# Patient Record
Sex: Male | Born: 1965 | Race: White | Hispanic: No | Marital: Married | Smoking: Light tobacco smoker
Health system: Southern US, Community
[De-identification: ages and names within clinical notes are randomized; demographics above are authoritative.]

## PROBLEM LIST (undated history)

## (undated) DIAGNOSIS — B029 Zoster without complications: Secondary | ICD-10-CM

## (undated) DIAGNOSIS — I351 Nonrheumatic aortic (valve) insufficiency: Secondary | ICD-10-CM

## (undated) DIAGNOSIS — IMO0002 Reserved for concepts with insufficient information to code with codable children: Secondary | ICD-10-CM

## (undated) DIAGNOSIS — Z86010 Personal history of colonic polyps: Secondary | ICD-10-CM

## (undated) DIAGNOSIS — T7840XA Allergy, unspecified, initial encounter: Secondary | ICD-10-CM

## (undated) DIAGNOSIS — Q231 Congenital insufficiency of aortic valve: Secondary | ICD-10-CM

## (undated) DIAGNOSIS — E7800 Pure hypercholesterolemia, unspecified: Secondary | ICD-10-CM

## (undated) DIAGNOSIS — D869 Sarcoidosis, unspecified: Secondary | ICD-10-CM

## (undated) HISTORY — PX: TONSILLECTOMY: SUR1361

## (undated) HISTORY — DX: Zoster without complications: B02.9

## (undated) HISTORY — PX: HERNIA REPAIR: SHX51

## (undated) HISTORY — DX: Allergy, unspecified, initial encounter: T78.40XA

## (undated) HISTORY — DX: Nonrheumatic aortic (valve) insufficiency: I35.1

## (undated) HISTORY — DX: Sarcoidosis, unspecified: D86.9

## (undated) HISTORY — DX: Pure hypercholesterolemia, unspecified: E78.00

## (undated) HISTORY — DX: Personal history of colonic polyps: Z86.010

## (undated) HISTORY — DX: Reserved for concepts with insufficient information to code with codable children: IMO0002

---

## 2010-12-19 ENCOUNTER — Ambulatory Visit (INDEPENDENT_AMBULATORY_CARE_PROVIDER_SITE_OTHER): Payer: Self-pay | Admitting: Cardiology

## 2010-12-19 ENCOUNTER — Encounter: Payer: Self-pay | Admitting: Cardiology

## 2010-12-19 DIAGNOSIS — I359 Nonrheumatic aortic valve disorder, unspecified: Secondary | ICD-10-CM

## 2010-12-19 DIAGNOSIS — E785 Hyperlipidemia, unspecified: Secondary | ICD-10-CM | POA: Insufficient documentation

## 2010-12-19 DIAGNOSIS — I351 Nonrheumatic aortic (valve) insufficiency: Secondary | ICD-10-CM | POA: Insufficient documentation

## 2010-12-19 DIAGNOSIS — Z720 Tobacco use: Secondary | ICD-10-CM | POA: Insufficient documentation

## 2010-12-19 DIAGNOSIS — F17200 Nicotine dependence, unspecified, uncomplicated: Secondary | ICD-10-CM

## 2010-12-19 MED ORDER — VARENICLINE TARTRATE 1 MG PO TABS: 1.0000 mg | ORAL_TABLET | Freq: Two times a day (BID) | ORAL | Status: AC

## 2010-12-19 MED ORDER — VARENICLINE TARTRATE 1 MG PO TABS: 1.0000 mg | ORAL_TABLET | Freq: Two times a day (BID) | ORAL | Status: DC

## 2010-12-19 NOTE — Assessment & Plan Note (Signed)
He understands the need to stop smoking.  He quit before on Chantix and he wants to try this again.

## 2010-12-19 NOTE — Assessment & Plan Note (Signed)
He reports that this is well controlled.  I will defer to Garlan Fillers, MD

## 2010-12-19 NOTE — Patient Instructions (Addendum)
The current medical regimen is effective;  continue present plan and medications.  You may start Chantix for smoking cessation as directed.  Your physician has requested that you have an echocardiogram. Echocardiography is a painless test that uses sound waves to create images of your heart. It provides your doctor with information about the size and shape of your heart and how well your heart's chambers and valves are working. This procedure takes approximately one hour. There are no restrictions for this procedure.  Follow up in 1 year with Dr Antoine Poche.  You will receive a letter in the mail 2 months before you are due.  Please call us when you receive this letter to schedule your follow up appointment.

## 2010-12-19 NOTE — Assessment & Plan Note (Signed)
He has no symptoms.  He is overdue for follow up echo.  I will order this.  We discussed the physiology of this valve problem at length.

## 2010-12-19 NOTE — Progress Notes (Signed)
HPI The patient presents for follow up of aortic insufficiency.  He was previously followed by Dr. Deborah Chalk.  He has a history bicuspid Ao Valve with AI.  His last echo was 2010.  He had moderate to severe insufficiency with mild LVH. His LV end-diastolic dimension was only 57 mm however. He presents for follow and is overdue for his one year visit.  He reports that he does well.  The patient denies any new symptoms such as chest discomfort, neck or arm discomfort. There has been no new shortness of breath, PND or orthopnea. There have been no reported palpitations, presyncope or syncope.  He exercises 3 to 4 times per week.  He did stop smoking for a while.  However, he is back to smoking now.  No Known Allergies  Current Outpatient Prescriptions  Medication Sig Dispense Refill  . aspirin 325 MG tablet Take 325 mg by mouth as needed.        Marland Kitchen co-enzyme Q-10 30 MG capsule Take 30 mg by mouth daily.        Marland Kitchen LOVASTATIN PO Take by mouth daily.          Past Medical History  Diagnosis Date  . Aortic insufficiency   . Hypercholesteremia   . DDD (degenerative disc disease)   . Tobacco abuse     Past Surgical History  Procedure Date  . Hernia repair     ROS:  As stated in the HPI and negative for all other systems.  PHYSICAL EXAM BP 118/62  Pulse 57  Resp 16  Ht 6\' 4"  (1.93 m)  Wt 215 lb (97.523 kg)  BMI 26.17 kg/m2 GENERAL:  Well appearing HEENT:  Pupils equal round and reactive, fundi not visualized, oral mucosa unremarkable NECK:  No jugular venous distention, waveform within normal limits, carotid upstroke brisk and symmetric, no bruits, no thyromegaly LYMPHATICS:  No cervical, inguinal adenopathy LUNGS:  Clear to auscultation bilaterally BACK:  No CVA tenderness CHEST:  Unremarkable HEART:  PMI not displaced or sustained,S1 and S2 within normal limits, no S3, no S4, no clicks, no rubs, 2/6 diastolic murmur at the 3rd left interspace ABD:  Flat, positive bowel sounds normal in  frequency in pitch, no bruits, no rebound, no guarding, no midline pulsatile mass, no hepatomegaly, no splenomegaly EXT:  2 plus pulses throughout, no edema, no cyanosis no clubbing SKIN:  No rashes no nodules NEURO:  Cranial nerves II through XII grossly intact, motor grossly intact throughout PSYCH:  Cognitively intact, oriented to person place and time  EKG:  Sinus rhythm, rate 63, axis within normal limits, intervals within normal limits, no acute ST-T wave changes.   ASSESSMENT AND PLAN

## 2010-12-26 ENCOUNTER — Encounter: Payer: Self-pay | Admitting: *Deleted

## 2010-12-26 ENCOUNTER — Ambulatory Visit (HOSPITAL_COMMUNITY): Payer: BC Managed Care – PPO | Attending: Cardiology

## 2010-12-26 DIAGNOSIS — I351 Nonrheumatic aortic (valve) insufficiency: Secondary | ICD-10-CM

## 2010-12-26 DIAGNOSIS — E785 Hyperlipidemia, unspecified: Secondary | ICD-10-CM | POA: Insufficient documentation

## 2010-12-26 DIAGNOSIS — I359 Nonrheumatic aortic valve disorder, unspecified: Secondary | ICD-10-CM

## 2010-12-26 DIAGNOSIS — I080 Rheumatic disorders of both mitral and aortic valves: Secondary | ICD-10-CM | POA: Insufficient documentation

## 2010-12-26 DIAGNOSIS — F17200 Nicotine dependence, unspecified, uncomplicated: Secondary | ICD-10-CM | POA: Insufficient documentation

## 2010-12-26 DIAGNOSIS — I079 Rheumatic tricuspid valve disease, unspecified: Secondary | ICD-10-CM | POA: Insufficient documentation

## 2011-01-11 ENCOUNTER — Encounter: Payer: Self-pay | Admitting: *Deleted

## 2012-03-06 ENCOUNTER — Encounter (HOSPITAL_COMMUNITY): Payer: Self-pay | Admitting: Emergency Medicine

## 2012-03-06 ENCOUNTER — Emergency Department (HOSPITAL_COMMUNITY): Payer: BC Managed Care – PPO

## 2012-03-06 ENCOUNTER — Other Ambulatory Visit: Payer: Self-pay

## 2012-03-06 ENCOUNTER — Observation Stay (HOSPITAL_COMMUNITY)
Admission: EM | Admit: 2012-03-06 | Discharge: 2012-03-08 | DRG: 099 | Disposition: A | Discharge status: Home / Self Care | Payer: BC Managed Care – PPO | Attending: Internal Medicine | Admitting: Internal Medicine

## 2012-03-06 DIAGNOSIS — Q231 Congenital insufficiency of aortic valve: Secondary | ICD-10-CM | POA: Insufficient documentation

## 2012-03-06 DIAGNOSIS — R918 Other nonspecific abnormal finding of lung field: Principal | ICD-10-CM | POA: Insufficient documentation

## 2012-03-06 DIAGNOSIS — Z7982 Long term (current) use of aspirin: Secondary | ICD-10-CM | POA: Insufficient documentation

## 2012-03-06 DIAGNOSIS — J189 Pneumonia, unspecified organism: Secondary | ICD-10-CM

## 2012-03-06 DIAGNOSIS — B49 Unspecified mycosis: Secondary | ICD-10-CM

## 2012-03-06 DIAGNOSIS — F17200 Nicotine dependence, unspecified, uncomplicated: Secondary | ICD-10-CM | POA: Insufficient documentation

## 2012-03-06 DIAGNOSIS — D869 Sarcoidosis, unspecified: Secondary | ICD-10-CM

## 2012-03-06 DIAGNOSIS — R059 Cough, unspecified: Secondary | ICD-10-CM | POA: Insufficient documentation

## 2012-03-06 DIAGNOSIS — R05 Cough: Secondary | ICD-10-CM | POA: Insufficient documentation

## 2012-03-06 DIAGNOSIS — Z720 Tobacco use: Secondary | ICD-10-CM

## 2012-03-06 DIAGNOSIS — M549 Dorsalgia, unspecified: Secondary | ICD-10-CM | POA: Insufficient documentation

## 2012-03-06 DIAGNOSIS — I359 Nonrheumatic aortic valve disorder, unspecified: Secondary | ICD-10-CM | POA: Insufficient documentation

## 2012-03-06 DIAGNOSIS — IMO0002 Reserved for concepts with insufficient information to code with codable children: Secondary | ICD-10-CM | POA: Insufficient documentation

## 2012-03-06 DIAGNOSIS — E7800 Pure hypercholesterolemia, unspecified: Secondary | ICD-10-CM | POA: Insufficient documentation

## 2012-03-06 HISTORY — DX: Congenital insufficiency of aortic valve: Q23.1

## 2012-03-06 HISTORY — DX: Sarcoidosis, unspecified: D86.9

## 2012-03-06 LAB — BASIC METABOLIC PANEL
BUN: 14 mg/dL (ref 6–23)
CO2: 24 mEq/L (ref 19–32)
Calcium: 9.6 mg/dL (ref 8.4–10.5)
Chloride: 104 mEq/L (ref 96–112)
Creatinine, Ser: 1.07 mg/dL (ref 0.50–1.35)
GFR calc Af Amer: >90 mL/min (ref >90)
GFR calc non Af Amer: 81 mL/min — ABNORMAL LOW (ref >90)
Glucose, Bld: 97 mg/dL (ref 70–99)
Potassium: 4.2 mEq/L (ref 3.5–5.1)
Sodium: 139 mEq/L (ref 135–145)

## 2012-03-06 LAB — CBC WITH DIFFERENTIAL/PLATELET
Basophils Absolute: 0.1 10*3/uL (ref 0.0–0.1)
Basophils Relative: 1 % (ref 0–1)
Eosinophils Absolute: 0.5 10*3/uL (ref 0.0–0.7)
Eosinophils Relative: 8 % — ABNORMAL HIGH (ref 0–5)
HCT: 47.2 % (ref 39.0–52.0)
Hemoglobin: 16.3 g/dL (ref 13.0–17.0)
Lymphocytes Relative: 19 % (ref 12–46)
Lymphs Abs: 1.3 10*3/uL (ref 0.7–4.0)
MCH: 30.9 pg (ref 26.0–34.0)
MCHC: 34.5 g/dL (ref 30.0–36.0)
MCV: 89.4 fL (ref 78.0–100.0)
Monocytes Absolute: 0.7 10*3/uL (ref 0.1–1.0)
Monocytes Relative: 10 % (ref 3–12)
Neutro Abs: 4.4 10*3/uL (ref 1.7–7.7)
Neutrophils Relative %: 63 % (ref 43–77)
Platelets: 284 10*3/uL (ref 150–400)
RBC: 5.28 MIL/uL (ref 4.22–5.81)
RDW: 12.9 % (ref 11.5–15.5)
WBC: 6.9 10*3/uL (ref 4.0–10.5)

## 2012-03-06 LAB — CBC
HCT: 41.5 % (ref 39.0–52.0)
Hemoglobin: 13.7 g/dL (ref 13.0–17.0)
MCH: 29.4 pg (ref 26.0–34.0)
MCHC: 33 g/dL (ref 30.0–36.0)
MCV: 89.1 fL (ref 78.0–100.0)
Platelets: 236 10*3/uL (ref 150–400)
RBC: 4.66 MIL/uL (ref 4.22–5.81)
RDW: 12.8 % (ref 11.5–15.5)
WBC: 6.2 10*3/uL (ref 4.0–10.5)

## 2012-03-06 LAB — CREATININE, SERUM
Creatinine, Ser: 1.13 mg/dL (ref 0.50–1.35)
GFR calc Af Amer: 88 mL/min — ABNORMAL LOW (ref >90)
GFR calc non Af Amer: 76 mL/min — ABNORMAL LOW (ref >90)

## 2012-03-06 MED ORDER — HYDROMORPHONE HCL PF 1 MG/ML IJ SOLN
1.0000 mg | Freq: Once | INTRAMUSCULAR | Status: AC
  Administered 2012-03-06: 1 mg via INTRAVENOUS
  Filled 2012-03-06: qty 1

## 2012-03-06 MED ORDER — AZITHROMYCIN 250 MG PO TABS
500.0000 mg | ORAL_TABLET | Freq: Once | ORAL | Status: AC
  Administered 2012-03-06: 500 mg via ORAL
  Filled 2012-03-06: qty 2

## 2012-03-06 MED ORDER — OXYCODONE-ACETAMINOPHEN 5-325 MG PO TABS
1.0000 | ORAL_TABLET | Freq: Once | ORAL | Status: DC
  Administered 2012-03-06: 1 via ORAL
  Filled 2012-03-06: qty 1

## 2012-03-06 MED ORDER — PIPERACILLIN-TAZOBACTAM 3.375 G IVPB
3.3750 g | Freq: Once | INTRAVENOUS | Status: AC
  Administered 2012-03-06: 3.375 g via INTRAVENOUS
  Filled 2012-03-06: qty 50

## 2012-03-06 MED ORDER — HYDROMORPHONE HCL PF 1 MG/ML IJ SOLN
1.0000 mg | INTRAMUSCULAR | Status: DC | PRN
  Administered 2012-03-06 – 2012-03-07 (×2): 1 mg via INTRAVENOUS
  Filled 2012-03-06 (×2): qty 1

## 2012-03-06 MED ORDER — ONDANSETRON HCL 4 MG/2ML IJ SOLN
4.0000 mg | Freq: Once | INTRAMUSCULAR | Status: AC
  Administered 2012-03-06: 4 mg via INTRAVENOUS
  Filled 2012-03-06: qty 2

## 2012-03-06 MED ORDER — ENOXAPARIN SODIUM 40 MG/0.4ML ~~LOC~~ SOLN
40.0000 mg | SUBCUTANEOUS | Status: DC
  Administered 2012-03-06: 40 mg via SUBCUTANEOUS
  Filled 2012-03-06 (×3): qty 0.4

## 2012-03-06 MED ORDER — ALUM & MAG HYDROXIDE-SIMETH 200-200-20 MG/5ML PO SUSP: 30.0000 mL | Freq: Four times a day (QID) | ORAL | Status: DC | PRN

## 2012-03-06 MED ORDER — IOHEXOL 350 MG/ML SOLN
100.0000 mL | Freq: Once | INTRAVENOUS | Status: AC | PRN
  Administered 2012-03-06: 100 mL via INTRAVENOUS

## 2012-03-06 MED ORDER — HYDROCODONE-ACETAMINOPHEN 5-325 MG PO TABS
1.0000 | ORAL_TABLET | ORAL | Status: DC | PRN
  Administered 2012-03-07: 2 via ORAL
  Filled 2012-03-06: qty 2

## 2012-03-06 MED ORDER — SIMVASTATIN 20 MG PO TABS
20.0000 mg | ORAL_TABLET | Freq: Every day | ORAL | Status: DC
  Administered 2012-03-06 – 2012-03-07 (×2): 20 mg via ORAL
  Filled 2012-03-06 (×3): qty 1

## 2012-03-06 MED ORDER — DEXTROSE 5 % IV SOLN
1.0000 g | Freq: Once | INTRAVENOUS | Status: AC
  Administered 2012-03-06: 1 g via INTRAVENOUS
  Filled 2012-03-06: qty 10

## 2012-03-06 MED ORDER — PIPERACILLIN-TAZOBACTAM 3.375 G IVPB: 3.3750 g | Freq: Once | INTRAVENOUS | Status: DC

## 2012-03-06 MED ORDER — HYDROMORPHONE HCL PF 1 MG/ML IJ SOLN
1.0000 mg | Freq: Once | INTRAMUSCULAR | Status: DC
  Administered 2012-03-06: 1 mg via INTRAVENOUS
  Filled 2012-03-06: qty 1

## 2012-03-06 MED ORDER — PIPERACILLIN-TAZOBACTAM 3.375 G IVPB
3.3750 g | Freq: Three times a day (TID) | INTRAVENOUS | Status: DC
  Administered 2012-03-06 – 2012-03-07 (×3): 3.375 g via INTRAVENOUS
  Filled 2012-03-06 (×5): qty 50

## 2012-03-06 MED ORDER — OXYCODONE-ACETAMINOPHEN 5-325 MG PO TABS: 1.0000 | ORAL_TABLET | Freq: Once | ORAL | Status: DC

## 2012-03-06 NOTE — ED Notes (Signed)
DR CAMPOS IN TO SPEAK WITH PT

## 2012-03-06 NOTE — ED Provider Notes (Signed)
History     CSN: [id number]  Arrival date & time 03/06/12  1033   First MD Initiated Contact with Patient 03/06/12 1049      Chief Complaint  Patient presents with  . Back Pain    (Consider location/radiation/quality/duration/timing/severity/associated sxs/prior treatment) HPI Comments: Patient presents with gradual onset of left scapular pain for the past 2 days. Patient has also had a persistent non-productive cough for the past week. Pain was severe this morning and patient states that he fell to the floor because the pain was so bad. Pain is difficult to reproduce. Certain specific movements reproduce the pain however there is no tenderness to palpation. Patient took Tylenol yesterday with some relief. 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 No shortness of breath. It is not worse with deep breathing. No fever or cold symptoms. No weakness or tingling in his arms or legs. PERC negative. No leg swelling, h/o blood clots. Onset gradual. Course is constant. Pt is a smoker.   Patient is a 46 y.o. male presenting with back pain. The history is provided by the patient.  Back Pain  Pertinent negatives include no chest pain, no fever, no numbness and no weakness.    Past Medical History  Diagnosis Date  . Aortic insufficiency   . Hypercholesteremia   . DDD (degenerative disc disease)   . Tobacco abuse     Past Surgical History  Procedure Date  . Hernia repair     History reviewed. No pertinent family history.  History  Substance Use Topics  . Smoking status: Current Every Day Smoker  . Smokeless tobacco: Not on file  . Alcohol Use: Yes      Review of Systems  Constitutional: Negative for fever and unexpected weight change.  Respiratory: Positive for cough. Negative for shortness of breath.   Cardiovascular: Negative for chest pain and leg swelling.  Gastrointestinal: Negative for constipation.       Neg for fecal  incontinence  Genitourinary: Negative for hematuria and flank pain.       Negative for urinary incontinence or retention  Musculoskeletal: Positive for back pain.  Neurological: Negative for weakness and numbness.       Negative for saddle paresthesias     Allergies  Review of patient's allergies indicates no known allergies.  Home Medications   Current Outpatient Rx  Name  Route  Sig  Dispense  Refill  . ASPIRIN 325 MG PO TABS   Oral   Take 650 mg by mouth every 4 (four) hours as needed. For pain         . LOVASTATIN 20 MG PO TABS   Oral   Take 20 mg by mouth daily.           BP 136/63  Pulse 67  Temp 97.8 F (36.6 C) (Oral)  Resp 22  SpO2 98%  Physical Exam  Nursing note and vitals reviewed. Constitutional: He appears well-developed and well-nourished.  HENT:  Head: Normocephalic and atraumatic.  Eyes: Conjunctivae normal are normal.  Neck: Normal range of motion.  Cardiovascular: Normal rate.   Murmur heard.  Crescendo systolic (early) murmur is present with a grade of 2/6  Pulmonary/Chest: Effort normal and breath sounds normal. No respiratory distress. He has no wheezes.  Abdominal: Soft. Bowel sounds are normal. There is no tenderness. There is no rebound, no guarding and no CVA tenderness.  Musculoskeletal: Normal range of motion. He exhibits no tenderness.  Cervical back: Normal. He exhibits normal range of motion, no tenderness and no bony tenderness.       Thoracic back: Normal. He exhibits normal range of motion, no tenderness and no bony tenderness.       Lumbar back: Normal. He exhibits normal range of motion, no tenderness and no bony tenderness.       Back:       No step-off noted with palpation of spine.   Neurological: He is alert. He has normal reflexes. No sensory deficit. He exhibits normal muscle tone.       5/5 strength in entire lower extremities bilaterally. No sensation deficit.   Skin: Skin is warm and dry.  Psychiatric: He has  a normal mood and affect.    ED Course  Procedures (including critical care time)  Labs Reviewed  CBC WITH DIFFERENTIAL - Abnormal; Notable for the following:    Eosinophils Relative 8 (*)     All other components within normal limits  BASIC METABOLIC PANEL - Abnormal; Notable for the following:    GFR calc non Af Amer 81 (*)     All other components within normal limits  CULTURE, BLOOD (ROUTINE X 2)  CULTURE, BLOOD (ROUTINE X 2)  CULTURE, RESPIRATORY   Dg Chest 2 View  03/06/2012  *RADIOLOGY REPORT*  Clinical Data: Shortness of breath, chest pain, left infrascapular chest pain  CHEST - 2 VIEW  Comparison: None.  Findings: There are patchy areas of ill-defined airspace opacity projecting over the bilateral upper lobe, right anterior third rib region and left anterior second rib region, respectively.  No pleural effusion.  No cardiac enlargement.  No acute osseous finding.  IMPRESSION: Bilateral upper lobe patchy airspace opacities.  This finding is nonspecific and could represent alveolar filling processes such as pneumonia, aspiration, hemorrhage, much less likely protein or cells.  Bilateral synchronous upper lobe bronchogenic carcinoma would be less likely but possible given the history of smoking. Follow-up radiographic resolution after treatment is recommended in 4-6 weeks with PA and lateral chest radiographs.  If the finding does not resolve at that time, chest CT would be recommended.   Original Report Authenticated By: Christiana Pellant, M.D.    Ct Angio Chest Pe W/cm &/or Wo Cm  03/06/2012  *RADIOLOGY REPORT*  Clinical Data: Chest pain, airspace opacities on chest radiography performed today  CT ANGIOGRAPHY CHEST  Technique:  Multidetector CT imaging of the chest using the standard protocol during bolus administration of intravenous contrast. Multiplanar reconstructed images including MIPs were obtained and reviewed to evaluate the vascular anatomy.  Contrast: OMNIPAQUE IOHEXOL  350 MG/ML SOLN  Comparison: Chest radiograph same date  Findings: There are a few patchy irregular bilateral upper lobe airspace opacities, corresponding to findings on today's prior exam.  Right upper lobe mass like opacity measures 2.8 x 2.2 cm image 30 and left upper lobe airspace opacity measures 2.2 x 2.0 cm image 21.  Suggestion of internal air bronchogram formation is noted, particularly on the left.  3 mm right upper lobe pulmonary nodule also identified image 41.  Left upper lobe pulmonary nodule identified measuring 5 mm image 40.  Central airways are patent. No pleural effusion.  The study is of adequate technical quality for evaluation for pulmonary embolism up to and including the 3rd order pulmonary arteries.  No filling defect is identified up to and including the 3rd order pulmonary arteries to suggest acute pulmonary embolism. Great vessels are normal in caliber.  Heart size is upper  limits of normal.  No lymphadenopathy; small AP window lymph nodes are identified, largest 7 mm image 29 and pretracheal node measuring 9 mm image 31.  Incomplete imaging of the upper abdomen demonstrates normal- appearing adrenal gland but incomplete visualization of the left adrenal gland.  No acute osseous finding.  IMPRESSION: Bilateral pulmonary nodules and bilateral upper lobe irregular pulmonary airspace opacities with suggestion of internal air bronchogram formation.  This feature and the relatively hazy border of the lesions may be seen with fungal infection, other infectious etiologies, septic emboli, or less likely hemorrhage.  No CT evidence for acute pulmonary arterial embolism up to the 3rd order pulmonary arteries is identified.  Synchronous bronchogenic malignancy is less likely but possible. Follow-up to radiographic resolution as previously described is recommended.   Original Report Authenticated By: Christiana Pellant, M.D.      1. Community acquired pneumonia     11:19 AM Patient seen and  examined. CXR ordered.    Vital signs reviewed and are as follows: Filed Vitals:   03/06/12 1036  BP: 136/63  Pulse: 67  Temp: 97.8 F (36.6 C)  Resp: 22   11:38 AM X-ray reviewed by myself. D/w Dr. Patria Mane. Radiologist report reviewed. Labs, CT angio to further evaluate. Will move to CDU.   Handoff to [facility].    CT reviewed with Dr. Patria Mane. Given vague symptoms with these CT findings, feel admission for further eval is warranted. Medicine to admit. Pulm will consult.    MDM  Cough, abnormal CXR and CT. Admit for further eval. At this point will treat at CAP until process is further delineated.         Renne Crigler, [place] 03/06/12 1544  [place], [place] 03/07/12 1258

## 2012-03-06 NOTE — Progress Notes (Addendum)
ANTIBIOTIC CONSULT NOTE - INITIAL  Pharmacy Consult for Zosyn Indication: pulmonary/?PNA  No Known Allergies  Patient Measurements:   Adjusted Body Weight: n/a  Vital Signs: Temp: 97.8 F (36.6 C) (11/28 1036) Temp src: Oral (11/28 1036) BP: 118/59 mmHg (11/28 1845) Pulse Rate: 65  (11/28 1845) Intake/Output from previous day:   Intake/Output from this shift:    Labs:  Basename 03/06/12 1043  WBC 6.9  HGB 16.3  PLT 284  LABCREA --  CREATININE 1.07   The CrCl is unknown because both a height and weight (above a minimum accepted value) are required for this calculation. No results found for this basename: VANCOTROUGH:2,VANCOPEAK:2,VANCORANDOM:2,GENTTROUGH:2,GENTPEAK:2,GENTRANDOM:2,TOBRATROUGH:2,TOBRAPEAK:2,TOBRARND:2,AMIKACINPEAK:2,AMIKACINTROU:2,AMIKACIN:2, in the last 72 hours   Microbiology: No results found for this or any previous visit (from the past 720 hour(s)).  Medical History: Past Medical History  Diagnosis Date  . Aortic insufficiency   . Hypercholesteremia   . DDD (degenerative disc disease)   . Tobacco abuse     Medications:  Scheduled:    . [COMPLETED] azithromycin  500 mg Oral Once  . [COMPLETED] cefTRIAXone (ROCEPHIN)  IV  1 g Intravenous Once  . enoxaparin (LOVENOX) injection  40 mg Subcutaneous Q24H  . [COMPLETED]  HYDROmorphone (DILAUDID) injection  1 mg Intravenous Once  . [COMPLETED] ondansetron  4 mg Intravenous Once  . [COMPLETED] piperacillin-tazobactam (ZOSYN)  IV  3.375 g Intravenous Once  . simvastatin  20 mg Oral q1800  . [COMPLETED]  HYDROmorphone (DILAUDID) injection  1 mg Intravenous Once  . [COMPLETED] oxyCODONE-acetaminophen  1 tablet Oral Once  . [DISCONTINUED] oxyCODONE-acetaminophen  1 tablet Oral Once  . [DISCONTINUED] piperacillin-tazobactam (ZOSYN)  IV  3.375 g Intravenous Once   Assessment: 46 yo male admitted with pulmonary nodules/lesions of unclear etiology to begin empiric therapy with Zosyn.  Scr/CrCl WNL.    Goal of Therapy:  Resolution of possible infection  Plan:  1. Zosyn 3.375g IV q8 hrs. 2. F/U plans/cultures. 3. Will follow renal function.  Gardner Candle 03/06/2012,8:06 PM

## 2012-03-06 NOTE — ED Notes (Signed)
Called and gave report to [place], [place] [phone]

## 2012-03-06 NOTE — H&P (Signed)
PCP:   Garlan Fillers, MD   Chief Complaint:  This is a 46 year old Caucasian male who presented with significant right posterior shoulder discomfort unable to tolerate lasting approximately 24 hours  HPI: This 46 year old Caucasian male patient of Dr. Jarome Matin with a past medical history significant for hyperlipidemia and intermittent tobacco abuse who also has a bicuspid aortic valve followed by the power cardiology over numerous years. He was last seen by lumbar cardiology approximately greater than one year ago with echocardiogram EKG, no further notes noted however patient states that he thinks he has been seen within the last several months however no documentation in hospital computer system. He was last seen in our office I greater than one year ago for his annual physical exam. He does state that with the use of Chantix he did quit smoking up until last July, with a recurrence and wishes to restart Chantix for complete cessation appear Patient does note a significant upper respiratory tract infection approximately 3 weeks ago, venting him from going to work for approximately 2 days, treated with over-the-counter medications to include mucolytic agents and decongestants. Symptoms resolved, however he had a prolonged two-week history of morning cough productive of scant white sputum. Never had fevers, chills,. We'll discharge, significant shortness of breath or pleuritic chest pain. He did have some audible wheezing on occasion that is complicated by his ongoing tobacco abuse. Over the last one week, patient has been asymptomatic for the most part however within the last 24 hours developed some smoldering posterior right shoulder discomfort, that did not appear to become worse with movement of the shoulder, deep inhalation, and was not accompanied by any significant coughing, nausea, vomiting, chest pain, palpitations or fevers or chills. Patient was able to sleep last night with the help of  aspirin, however awoke this morning relatively better but then when reaching for the shower nozzle, developed worsening posterior right shoulder discomfort, that was the worst he said in his life, quite intolerable. He presented to the emergency room for further evaluation and management will with his wife driving. Again denies any fevers, chills, productive cough, pleuritic discomfort, injury pattern to the right shoulder. Denies chest pain in the left side, palpitations. Denies any recent falls, and denies any recent cough within the last one week. In the emergency room, patient was given Dilaudid 1 mg with good relief of his discomfort for approximately 5-6 hours but quite uncomfortable before this. Chest x-ray did reveal some bilateral superior pulmonary nodules with CT scan results as below concerning for bilateral pulmonary nodules of unclear etiology, question infection versus embolic with malignancy less likely. Patient is now being admitted for further evaluation and management. ER physician did talk with pulmonology on call, Dr. Stann Mainland, who did recommend aerobic and anaerobic treatment with Zosyn after blood cultures obtained, reevaluation in the morning for more tentative diagnosis versus outpatient management. Please note that patient does have history of a chest x-ray dating back to November 2012 which did reveal bilateral nodular type infiltrates is in the upper lung zones with recommendations for followup in 2-3 months however it is unclear whether the patient had followup.  Review of Systems:  Negative for fevers, chills, headaches, visual changes, swallowing difficulties, some episodic reflux treated with TUMS, negative for change in bowel habits, blood in stool or urine, negative for chest pain or palpitations negative for recent muscle skeletal injury and negative for similar symptoms in the past denies any significant contact with other individuals with similar infections or  symptoms. Past  Medical History: Past Medical History  Diagnosis Date  . Aortic insufficiency   . Hypercholesteremia   . DDD (degenerative disc disease)   . Tobacco abuse    bicuspid aortic valve with moderate to severe aortic regurgitation, normal ejection fraction 9/12 I'll in by lobe our cardiology Past Surgical History  Procedure Date  . Hernia repair     Medications: Prior to Admission medications   Medication Sig Start Date End Date Taking? Authorizing Provider  aspirin 325 MG tablet Take 650 mg by mouth every 4 (four) hours as needed. For pain   Yes Historical Provider, MD  lovastatin (MEVACOR) 20 MG tablet Take 20 mg by mouth daily.   Yes Historical Provider, MD    Allergies:  No Known Allergies  Social History:  reports that he has been smoking.  He does not have any smokeless tobacco history on file. He reports that he drinks alcohol. His drug history not on file. 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 No pertinent family history. significant for low back pain and hyperlipidemia  Physical Exam: Filed Vitals:   03/06/12 1345 03/06/12 1430 03/06/12 1515 03/06/12 1600  BP: 118/58 126/64 136/66 120/60  Pulse: 63 57 67 53  Temp:      TempSrc:      Resp:      SpO2: 97% 98% 99% 97%   General appearance, no apparent distress, pleasant, answering questions appropriately Head to normocephalic atraumatic, symmetric with no facial asymmetries No oropharyngeal lesions dentition good Extraconal movements are intact, sclera anicteric Tympanic membranes clear bilaterally No oropharyngeal lesions Neck supple, no cervical lymphadenopathy Lungs clear to auscultation bilaterally with no respiratory distress or pleuritic discomfort, no axillary lymphadenopathy Cardiovascular reveals a regular rate and rhythm with 2/6 systolic murmur Abdomen-soft, nontender, nondistended, bowel sounds present Note no edema, pedal pulses are intact, no evidence of  cyanosis or clubbing No spinal tenderness Neurologic exam no fine tremors, alert and oriented x3, normal normal strength and tone to route with symmetric reflexes Skin without evidence of rash      Labs on Admission:   Basename 03/06/12 1043  NA 139  K 4.2  CL 104  CO2 24  GLUCOSE 97  BUN 14  CREATININE 1.07  CALCIUM 9.6  MG --  PHOS --   No results found for this basename: AST:2,ALT:2,ALKPHOS:2,BILITOT:2,PROT:2,ALBUMIN:2 in the last 72 hours No results found for this basename: LIPASE:2,AMYLASE:2 in the last 72 hours  Basename 03/06/12 1043  WBC 6.9  NEUTROABS 4.4  HGB 16.3  HCT 47.2  MCV 89.4  PLT 284   No results found for this basename: CKTOTAL:3,CKMB:3,CKMBINDEX:3,TROPONINI:3 in the last 72 hours No results found for this basename: TSH,T4TOTAL,FREET3,T3FREE,THYROIDAB in the last 72 hours No results found for this basename: VITAMINB12:2,FOLATE:2,FERRITIN:2,TIBC:2,IRON:2,RETICCTPCT:2 in the last 72 hours  Radiological Exams on Admission: Dg Chest 2 View  03/06/2012  *RADIOLOGY REPORT*  Clinical Data: Shortness of breath, chest pain, left infrascapular chest pain  CHEST - 2 VIEW  Comparison: None.  Findings: There are patchy areas of ill-defined airspace opacity projecting over the bilateral upper lobe, right anterior third rib region and left anterior second rib region, respectively.  No pleural effusion.  No cardiac enlargement.  No acute osseous finding.  IMPRESSION: Bilateral upper lobe patchy airspace opacities.  This finding is nonspecific and could represent alveolar filling processes such as pneumonia, aspiration, hemorrhage, much less likely protein or cells.  Bilateral synchronous upper lobe bronchogenic carcinoma would be less  likely but possible given the history of smoking. Follow-up radiographic resolution after treatment is recommended in 4-6 weeks with PA and lateral chest radiographs.  If the finding does not resolve at that time, chest CT would be  recommended.   Original Report Authenticated By: Christiana Pellant, M.D.    Ct Angio Chest Pe W/cm &/or Wo Cm  03/06/2012  *RADIOLOGY REPORT*  Clinical Data: Chest pain, airspace opacities on chest radiography performed today  CT ANGIOGRAPHY CHEST  Technique:  Multidetector CT imaging of the chest using the standard protocol during bolus administration of intravenous contrast. Multiplanar reconstructed images including MIPs were obtained and reviewed to evaluate the vascular anatomy.  Contrast: OMNIPAQUE IOHEXOL 350 MG/ML SOLN  Comparison: Chest radiograph same date  Findings: There are a few patchy irregular bilateral upper lobe airspace opacities, corresponding to findings on today's prior exam.  Right upper lobe mass like opacity measures 2.8 x 2.2 cm image 30 and left upper lobe airspace opacity measures 2.2 x 2.0 cm image 21.  Suggestion of internal air bronchogram formation is noted, particularly on the left.  3 mm right upper lobe pulmonary nodule also identified image 41.  Left upper lobe pulmonary nodule identified measuring 5 mm image 40.  Central airways are patent. No pleural effusion.  The study is of adequate technical quality for evaluation for pulmonary embolism up to and including the 3rd order pulmonary arteries.  No filling defect is identified up to and including the 3rd order pulmonary arteries to suggest acute pulmonary embolism. Great vessels are normal in caliber.  Heart size is upper limits of normal.  No lymphadenopathy; small AP window lymph nodes are identified, largest 7 mm image 29 and pretracheal node measuring 9 mm image 31.  Incomplete imaging of the upper abdomen demonstrates normal- appearing adrenal gland but incomplete visualization of the left adrenal gland.  No acute osseous finding.  IMPRESSION: Bilateral pulmonary nodules and bilateral upper lobe irregular pulmonary airspace opacities with suggestion of internal air bronchogram formation.  This feature and the  relatively hazy border of the lesions may be seen with fungal infection, other infectious etiologies, septic emboli, or less likely hemorrhage.  No CT evidence for acute pulmonary arterial embolism up to the 3rd order pulmonary arteries is identified.  Synchronous bronchogenic malignancy is less likely but possible. Follow-up to radiographic resolution as previously described is recommended.   Original Report Authenticated By: Christiana Pellant, M.D.    Orders placed during the hospital encounter of 03/06/12  . EKG 12-LEAD  . EKG 12-LEAD    Assessment/Plan Principal Problem:  *Pulmonary nodules/lesions, multiple-unclear etiology complicated by significant posterior right shoulder discomfort and pain but does not appear to be musculoskeletal or related to pulmonary symptoms or cardiac etiology. Unclear whether current pain is directly related to patient's abnormal CT scan result findings given bilateral findings. Please note that patient did have a chest x-ray from November 2012 which also revealed bilateral nodular type infiltrates with recommendations for followup in 2-3 months however the patient was not seen for his annual physical exam and followup was not provided. Per discussion with pulmonology through the ER physician, patient was placed on broad-spectrum antibiotics to include aerobic and anaerobic coverage with Zosyn, will reevaluate in the morning. Question relationship of nodules to embolic phenomenon related to his eye cuspid aortic valve Active Problems:  Bicuspid aortic valve-last notes from cardiology date back to September 2012, will reorder EKG and echocardiogram for further evaluation and management, no evidence of volume overload, no  exertional difficulties from patient's report, patient has been exercising approximately 4 times a week with no exertional difficulties. Patient further denies presyncope Right shoulder pain-unclear etiology, range of motion fully intact, will provide a lot  of as needed with oral narcotics as a backup. Will follow symptomatically Hyperlipidemia we'll continue statin therapy, will check liver function tests in the morning  tobacco abuse-patient does wish to stop, did acquire good results with the use of Chantix in the past, will restart on an outpatient basis.   Meranda Dechaine R 03/06/2012, 5:18 PM

## 2012-03-06 NOTE — ED Notes (Signed)
Patient transported to CT 

## 2012-03-06 NOTE — ED Notes (Signed)
Pt c/o right sided shoulder pain that is worse with movement and palpation; pt sts feels like muscle spasm starting yesterday

## 2012-03-07 DIAGNOSIS — B49 Unspecified mycosis: Secondary | ICD-10-CM

## 2012-03-07 DIAGNOSIS — J189 Pneumonia, unspecified organism: Secondary | ICD-10-CM

## 2012-03-07 DIAGNOSIS — I359 Nonrheumatic aortic valve disorder, unspecified: Secondary | ICD-10-CM

## 2012-03-07 DIAGNOSIS — F17200 Nicotine dependence, unspecified, uncomplicated: Secondary | ICD-10-CM

## 2012-03-07 DIAGNOSIS — R918 Other nonspecific abnormal finding of lung field: Secondary | ICD-10-CM

## 2012-03-07 LAB — COMPREHENSIVE METABOLIC PANEL
ALT: 30 U/L (ref 0–53)
AST: 27 U/L (ref 0–37)
Albumin: 3.5 g/dL (ref 3.5–5.2)
Alkaline Phosphatase: 62 U/L (ref 39–117)
BUN: 13 mg/dL (ref 6–23)
CO2: 27 mEq/L (ref 19–32)
Calcium: 9 mg/dL (ref 8.4–10.5)
Chloride: 103 mEq/L (ref 96–112)
Creatinine, Ser: 1.16 mg/dL (ref 0.50–1.35)
GFR calc Af Amer: 85 mL/min — ABNORMAL LOW (ref >90)
GFR calc non Af Amer: 73 mL/min — ABNORMAL LOW (ref >90)
Glucose, Bld: 101 mg/dL — ABNORMAL HIGH (ref 70–99)
Potassium: 4.1 mEq/L (ref 3.5–5.1)
Sodium: 138 mEq/L (ref 135–145)
Total Bilirubin: 0.5 mg/dL (ref 0.3–1.2)
Total Protein: 6.2 g/dL (ref 6.0–8.3)

## 2012-03-07 LAB — EXPECTORATED SPUTUM ASSESSMENT W GRAM STAIN, RFLX TO RESP C: Special Requests: NORMAL

## 2012-03-07 LAB — CBC
HCT: 43.4 % (ref 39.0–52.0)
Hemoglobin: 14.2 g/dL (ref 13.0–17.0)
MCH: 29.5 pg (ref 26.0–34.0)
MCHC: 32.7 g/dL (ref 30.0–36.0)
MCV: 90.2 fL (ref 78.0–100.0)
Platelets: 217 10*3/uL (ref 150–400)
RBC: 4.81 MIL/uL (ref 4.22–5.81)
RDW: 12.9 % (ref 11.5–15.5)
WBC: 5.4 10*3/uL (ref 4.0–10.5)

## 2012-03-07 LAB — HIV ANTIBODY (ROUTINE TESTING W REFLEX): HIV: NONREACTIVE

## 2012-03-07 MED ORDER — ZOLPIDEM TARTRATE 5 MG PO TABS
10.0000 mg | ORAL_TABLET | Freq: Every evening | ORAL | Status: DC | PRN
  Administered 2012-03-07: 10 mg via ORAL
  Filled 2012-03-07: qty 2

## 2012-03-07 NOTE — ED Provider Notes (Signed)
Medical screening examination/treatment/procedure(s) were performed by non-physician practitioner and as supervising physician I was immediately available for consultation/collaboration.   Lyanne Co, MD 03/07/12 [id number]

## 2012-03-07 NOTE — Progress Notes (Signed)
  Echocardiogram 2D Echocardiogram has been performed.  Victor Dunlap 03/07/2012, 10:19 AM

## 2012-03-07 NOTE — Consult Note (Signed)
PULMONARY  / CRITICAL CARE MEDICINE  Name: Victor Dunlap MRN: [id number] DOB: 02/01/1966    LOS: 1  REFERRING PROVIDER:  Dr. Felipa Eth  CHIEF COMPLAINT:  Chest Pain / ? PNA / Pulmonary Nodules  BRIEF PATIENT DESCRIPTION: Victor Dunlap admitted on 11/28 with a 3 week hx of URI that resolved.  Residual cough remained with mild white sputum production.  24 hours prior to presentation developed right shoulder pain, pain with inspiration.  CXR demonstrated bilateral superior pulmonary nodules.  CT notes bilateral pulmonary nodules, upper lobe opacities concerning for atypical infection vs septic emboli.  PCCM Consulted for abnormal CT / Nodules.    LINES / TUBES:   CULTURES: 11/28 Resp Culture>>>few GNR's, few GN coccobacilli, few GPC's in pairs>>> 11/28 BCx2>>> 11/29 Sputum Fungal >>> 11/29 Sputum AFB>>>  ANTIBIOTICS: 11/28 Rocephin>>> 11/28 Azithro>>> 11/28 Zosyn>>>  SIGNIFICANT EVENTS:  11/28 CTA CHEST>>>Bilateral pulmonary nodules and bilateral upper lobe irregular pulmonary airspace opacities with suggestion of internal air bronchogram formation. This feature and the relatively hazy border of the lesions may be seen with fungal infection, other infectious etiologies, septic emboli, or less likely hemorrhage. No CT evidence for acute pulmonary arterial embolism up to the 3rd order pulmonary arteries is identified. Synchronous bronchogenic malignancy is less likely but possible. Follow-up to radiographic resolution as previously described is recommended.   LEVEL OF CARE:  Floor PRIMARY SERVICE:  Dr. Felipa Eth CONSULTANTS:  PCCM CODE STATUS:  Full DIET:  PO DVT Px:  Ambulatory GI Px:  None Indicated  HISTORY OF PRESENT ILLNESS:  Victor Dunlap with PMH of DDD, Aortic insufficiency admitted on 11/28 with a 3 week hx of URI that resolved.  Residual cough remained with mild white sputum production.  24 hours prior to presentation developed right shoulder pain, pain with inspiration.  CXR demonstrated  bilateral superior pulmonary nodules.  CT notes bilateral pulmonary nodules, upper lobe opacities concerning for atypical infection vs septic emboli.  PCCM Consulted for abnormal CT / Nodules.    PAST MEDICAL HISTORY :  Past Medical History  Diagnosis Date  . Aortic insufficiency   . Hypercholesteremia   . DDD (degenerative disc disease)   . Heart murmur   . Pulmonary nodules/lesions, multiple 03/06/2012    "right" (03/06/2012)  . Bicuspid aortic valve    Past Surgical History  Procedure Date  . Tonsillectomy 1970's  . Hernia repair ~ 1968    "umbilical" (03/06/2012)   Prior to Admission medications   Medication Sig Start Date End Date Taking? Authorizing Provider  aspirin 325 MG tablet Take 650 mg by mouth every 4 (four) hours as needed. For pain   Yes Historical Provider, MD  lovastatin (MEVACOR) 20 MG tablet Take 20 mg by mouth daily.   Yes Historical Provider, MD   No Known Allergies  FAMILY HISTORY:  History reviewed. No pertinent family history. SOCIAL HISTORY:  reports that he has been smoking Cigarettes.  He has a 5.5 pack-year smoking history. He has never used smokeless tobacco. He reports that he drinks about Victor ounces of alcohol per week. He reports that he does not use illicit drugs.  REVIEW OF SYSTEMS:   Constitutional: Negative for fever, chills, malaise/fatigue and diaphoresis. Notes 5 lb weight loss in last 4 months as he has been working out more heavily.   HENT: Negative for hearing loss, ear pain, nosebleeds, congestion, sore throat, neck pain, tinnitus and ear discharge.   Eyes: Negative for blurred vision, double vision, photophobia, pain, discharge and redness.  Respiratory: Negative for cough, hemoptysis, sputum production, shortness of breath, wheezing and stridor.   Cardiovascular: Negative for chest pain, palpitations, orthopnea, claudication, leg swelling and PND.  Gastrointestinal: Negative for heartburn, nausea, vomiting, abdominal pain, diarrhea,  constipation, blood in stool and melena.  Genitourinary: Negative for dysuria, urgency, frequency, hematuria and flank pain.  Musculoskeletal: Negative for myalgias, joint pain and falls.  Reports posterior back pain x 24 hours, non-radiating Skin: Negative for itching and rash.  Neurological: Negative for dizziness, tingling, tremors, sensory change, speech change, focal weakness, seizures, loss of consciousness, weakness and headaches.  Endo/Heme/Allergies: Negative for environmental allergies and polydipsia. Does not bruise/bleed easily.  INTERVAL HISTORY:  Well developed in NAD lying in bed.   VITAL SIGNS: Temp:  [98.5 F (36.9 C)] 98.5 F (36.9 C) (11/28 2011) Pulse Rate:  [53-70] 54  (11/28 2011) Resp:  [16-20] 20  (11/28 2011) BP: (118-136)/(51-95) 122/51 mmHg (11/28 2011) SpO2:  [97 %-100 %] 100 % (11/28 2011) Weight:  [224 lb 13.9 oz (102 kg)] 224 lb 13.9 oz (102 kg) (11/28 2011)  PHYSICAL EXAMINATION: General:  wdwn male in NAD Neuro:  AAOx4, speech clear, MAE HEENT:  Mm pink/moist Cardiovascular:  s1s2 rrr, no Dunlap/r/g Lungs:  resp's even / non-labored, lungs bilaterally clear Abdomen:  Flat / soft, BSx4 active Musculoskeletal: no acute deformities Skin:  Warm/dry, no edema.     Lab 03/07/12 0630 03/06/12 2015 03/06/12 1043  NA 138 -- 139  K 4.1 -- 4.2  CL 103 -- 104  CO2 27 -- 24  BUN 13 -- 14  CREATININE 1.16 1.13 1.07  GLUCOSE 101* -- 97    Lab 03/07/12 0630 03/06/12 2015 03/06/12 1043  HGB 14.2 13.7 16.3  HCT 43.4 41.5 Victor.2  WBC 5.4 Victor.2 Victor.9  PLT 217 236 284   Dg Chest 2 View  03/06/2012  *RADIOLOGY REPORT*  Clinical Data: Shortness of breath, chest pain, left infrascapular chest pain  CHEST - 2 VIEW  Comparison: None.  Findings: There are patchy areas of ill-defined airspace opacity projecting over the bilateral upper lobe, right anterior third rib region and left anterior second rib region, respectively.  No pleural effusion.  No cardiac enlargement.  No  acute osseous finding.  IMPRESSION: Bilateral upper lobe patchy airspace opacities.  This finding is nonspecific and could represent alveolar filling processes such as pneumonia, aspiration, hemorrhage, much less likely protein or cells.  Bilateral synchronous upper lobe bronchogenic carcinoma would be less likely but possible given the history of smoking. Follow-up radiographic resolution after treatment is recommended in 4-Victor weeks with PA and lateral chest radiographs.  If the finding does not resolve at that time, chest CT would be recommended.   Original Report Authenticated By: Christiana Pellant, Dunlap.D.    Ct Angio Chest Pe W/cm &/or Wo Cm  03/06/2012  *RADIOLOGY REPORT*  Clinical Data: Chest pain, airspace opacities on chest radiography performed today  CT ANGIOGRAPHY CHEST  Technique:  Multidetector CT imaging of the chest using the standard protocol during bolus administration of intravenous contrast. Multiplanar reconstructed images including MIPs were obtained and reviewed to evaluate the vascular anatomy.  Contrast: OMNIPAQUE IOHEXOL 350 MG/ML SOLN  Comparison: Chest radiograph same date  Findings: There are a few patchy irregular bilateral upper lobe airspace opacities, corresponding to findings on today's prior exam.  Right upper lobe mass like opacity measures 2.8 x 2.2 cm image 30 and left upper lobe airspace opacity measures 2.2 x 2.0 cm image 21.  Suggestion of  internal air bronchogram formation is noted, particularly on the left.  3 mm right upper lobe pulmonary nodule also identified image 41.  Left upper lobe pulmonary nodule identified measuring 5 mm image 40.  Central airways are patent. No pleural effusion.  The study is of adequate technical quality for evaluation for pulmonary embolism up to and including the 3rd order pulmonary arteries.  No filling defect is identified up to and including the 3rd order pulmonary arteries to suggest acute pulmonary embolism. Great vessels are normal in  caliber.  Heart size is upper limits of normal.  No lymphadenopathy; small AP window lymph nodes are identified, largest 7 mm image 29 and pretracheal node measuring 9 mm image 31.  Incomplete imaging of the upper abdomen demonstrates normal- appearing adrenal gland but incomplete visualization of the left adrenal gland.  No acute osseous finding.  IMPRESSION: Bilateral pulmonary nodules and bilateral upper lobe irregular pulmonary airspace opacities with suggestion of internal air bronchogram formation.  This feature and the relatively hazy border of the lesions may be seen with fungal infection, other infectious etiologies, septic emboli, or less likely hemorrhage.  No CT evidence for acute pulmonary arterial embolism up to the 3rd order pulmonary arteries is identified.  Synchronous bronchogenic malignancy is less likely but possible. Follow-up to radiographic resolution as previously described is recommended.   Original Report Authenticated By: Christiana Pellant, Dunlap.D.     ASSESSMENT / PLAN:  Bilateral Pulmonary Nodules  Right Sided Chest Pain  Assessment: 46 y/o Dunlap, 7 pack year smoker, with bilateral irregular airspace opacities, no associated LAN.  He has not traveled outside of the country, denies dental issues, binge drinking, IV drug use, pet birds, weight loss, night sweats.  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 He had an URI in late October that resolved.  CT concerning for atypical infection.    Plan: -Continue abx as above. -Follow cultures -Send histo antigen, quantiferon gold -ID consult for recommendations regarding abx / anti-fungals (if any) -Will attempt to set up for bronch for Monday  Canary Brim, NP-C Pulmonary and Critical Care Medicine [facility] Pager: [phone]  03/07/2012, 1:51 PM  CXR showed shadows bilaterally on the apecies 1 year ago.  This is highly unlikely to be  a bacterial event.  Cancer is also highly unlikely here.  The differential diagnosis includes mycobacterial (again unlikely) or fungal infection (much more likely).  Cancer can not be ruled out but again the history, physical and radiologic findings do not support it.  Will consult ID to see if antibiotics are necessary.  Patient is scheduled for a bronchoscopy on Monday December second at 2 PM.  If discharged prior to that then come back for bronch then if not then will bronch at that scheduled time.  In the meantime, abx will be deferred to ID and if patient is here on Monday will see on Monday and bronch if not then I gave him all the information to come to get bronched on Monday with time, location, phone number and card.  Alyson Reedy, Dunlap.D. [facility] Pulmonary/Critical Care Medicine. Pager: [phone]. After hours pager: [phone].

## 2012-03-07 NOTE — Progress Notes (Signed)
Subjective: Right sided chest pain is much improved, not having dyspnea or fever or chills. He is having difficulty sleeping in the hospital setting.   Objective: Vital signs in last 24 hours: Temp:  [98.5 F (36.9 C)] 98.5 F (36.9 C) (11/28 2011) Pulse Rate:  [50-70] 54  (11/28 2011) Resp:  [16-20] 20  (11/28 2011) BP: (118-136)/(51-95) 122/51 mmHg (11/28 2011) SpO2:  [96 %-100 %] 100 % (11/28 2011) Weight:  [102 kg (224 lb 13.9 oz)] 102 kg (224 lb 13.9 oz) (11/28 2011) Weight change:    Intake/Output from previous day:     General appearance: alert, cooperative and no distress Resp: clear to auscultation bilaterally and with no pleuritic chest pain Cardio: regular rate and rhythm and with a 2/6 SEM and 2/6 AI murmur Extremities: extremities normal, atraumatic, no cyanosis or edema  Lab Results:  Basename 03/07/12 0630 03/06/12 2015  WBC 5.4 6.2  HGB 14.2 13.7  HCT 43.4 41.5  PLT 217 236   BMET  Basename 03/07/12 0630 03/06/12 2015 03/06/12 1043  NA 138 -- 139  K 4.1 -- 4.2  CL 103 -- 104  CO2 27 -- 24  GLUCOSE 101* -- 97  BUN 13 -- 14  CREATININE 1.16 1.13 --  CALCIUM 9.0 -- 9.6   CMET CMP     Component Value Date/Time   NA 138 03/07/2012 0630   K 4.1 03/07/2012 0630   CL 103 03/07/2012 0630   CO2 27 03/07/2012 0630   GLUCOSE 101* 03/07/2012 0630   BUN 13 03/07/2012 0630   CREATININE 1.16 03/07/2012 0630   CALCIUM 9.0 03/07/2012 0630   PROT 6.2 03/07/2012 0630   ALBUMIN 3.5 03/07/2012 0630   AST 27 03/07/2012 0630   ALT 30 03/07/2012 0630   ALKPHOS 62 03/07/2012 0630   BILITOT 0.5 03/07/2012 0630   GFRNONAA 73* 03/07/2012 0630   GFRAA 85* 03/07/2012 0630    CBG (last 3)  No results found for this basename: GLUCAP:3 in the last 72 hours  INR RESULTS:   No results found for this basename: INR, PROTIME     Studies/Results: Dg Chest 2 View  03/06/2012  *RADIOLOGY REPORT*  Clinical Data: Shortness of breath, chest pain, left infrascapular  chest pain  CHEST - 2 VIEW  Comparison: None.  Findings: There are patchy areas of ill-defined airspace opacity projecting over the bilateral upper lobe, right anterior third rib region and left anterior second rib region, respectively.  No pleural effusion.  No cardiac enlargement.  No acute osseous finding.  IMPRESSION: Bilateral upper lobe patchy airspace opacities.  This finding is nonspecific and could represent alveolar filling processes such as pneumonia, aspiration, hemorrhage, much less likely protein or cells.  Bilateral synchronous upper lobe bronchogenic carcinoma would be less likely but possible given the history of smoking. Follow-up radiographic resolution after treatment is recommended in 4-6 weeks with PA and lateral chest radiographs.  If the finding does not resolve at that time, chest CT would be recommended.   Original Report Authenticated By: Christiana Pellant, M.D.    Ct Angio Chest Pe W/cm &/or Wo Cm  03/06/2012  *RADIOLOGY REPORT*  Clinical Data: Chest pain, airspace opacities on chest radiography performed today  CT ANGIOGRAPHY CHEST  Technique:  Multidetector CT imaging of the chest using the standard protocol during bolus administration of intravenous contrast. Multiplanar reconstructed images including MIPs were obtained and reviewed to evaluate the vascular anatomy.  Contrast: OMNIPAQUE IOHEXOL 350 MG/ML SOLN  Comparison: Chest radiograph  same date  Findings: There are a few patchy irregular bilateral upper lobe airspace opacities, corresponding to findings on today's prior exam.  Right upper lobe mass like opacity measures 2.8 x 2.2 cm image 30 and left upper lobe airspace opacity measures 2.2 x 2.0 cm image 21.  Suggestion of internal air bronchogram formation is noted, particularly on the left.  3 mm right upper lobe pulmonary nodule also identified image 41.  Left upper lobe pulmonary nodule identified measuring 5 mm image 40.  Central airways are patent. No pleural  effusion.  The study is of adequate technical quality for evaluation for pulmonary embolism up to and including the 3rd order pulmonary arteries.  No filling defect is identified up to and including the 3rd order pulmonary arteries to suggest acute pulmonary embolism. Great vessels are normal in caliber.  Heart size is upper limits of normal.  No lymphadenopathy; small AP window lymph nodes are identified, largest 7 mm image 29 and pretracheal node measuring 9 mm image 31.  Incomplete imaging of the upper abdomen demonstrates normal- appearing adrenal gland but incomplete visualization of the left adrenal gland.  No acute osseous finding.  IMPRESSION: Bilateral pulmonary nodules and bilateral upper lobe irregular pulmonary airspace opacities with suggestion of internal air bronchogram formation.  This feature and the relatively hazy border of the lesions may be seen with fungal infection, other infectious etiologies, septic emboli, or less likely hemorrhage.  No CT evidence for acute pulmonary arterial embolism up to the 3rd order pulmonary arteries is identified.  Synchronous bronchogenic malignancy is less likely but possible. Follow-up to radiographic resolution as previously described is recommended.   Original Report Authenticated By: Christiana Pellant, M.D.     Medications: I have reviewed the patient's current medications.  Assessment/Plan: #1 Chest pain:  Likely due to atypical pneumonia, possibly from fungus. Less likely from bronchogenic carcinoma. Clinically stable. We will continue current meds, try to obtain sputum for culture, request pulmonary consult. I went over the CT images with him and assured him that this is not likely from bronchogenic cancer. #2 Aortic Insufficiency:  Clinically stable and able to do exercise without difficulty. An echo has been done today with read pending.   LOS: 1 day   Emma Schupp G 03/07/2012, 10:41 AM

## 2012-03-07 NOTE — Consult Note (Signed)
[facility]     Reason for Consult: pulmonary nodules    Referring Physician: Dr. Georgina Quint. Molli Knock  Principal Problem:  *Pulmonary nodules/lesions, multiple Active Problems:  Bicuspid aortic valve      . enoxaparin (LOVENOX) injection  40 mg Subcutaneous Q24H  . [COMPLETED]  HYDROmorphone (DILAUDID) injection  1 mg Intravenous Once  . [COMPLETED] piperacillin-tazobactam (ZOSYN)  IV  3.375 g Intravenous Once  . piperacillin-tazobactam (ZOSYN)  IV  3.375 g Intravenous Q8H  . simvastatin  20 mg Oral q1800  . [DISCONTINUED] oxyCODONE-acetaminophen  1 tablet Oral Once  . [DISCONTINUED] piperacillin-tazobactam (ZOSYN)  IV  3.375 g Intravenous Once    Recommendations: Stop antibiotics Check beta d glucan, aspergillus galactomanin (though sensitivity with recent Zosyn is poor) Consider bronchoscopy for fungal smear and culture, AFB smear and culture, cytology From an ID perspective, this can be done as an outpatient We will follow up with him as an outpatient in about 1-2 weeks after bronchoscopy results, though will see him again Monday if he is still inpatient Quantiferon test ordered by pulmonary, if positive, there is no indication for isolation, it is not a test for active tuberculosis  Checking HIV Ab for routine screening  Assessment: Bilateral pulmonary nodules that have been present since a CXR done by [facility] on November 2012 scan.  The size comparison is not known since it is not in the hospital system, or if there are more nodules now compared to previous.  Infection is certainly possible though with this prolonged course of nodules, a bacterial infection is very unlikely.  Aspergillus less likely with no systemic symptoms of weight loss, fatigue. Other fungal or mycobacterial infections possible vs other, such as carcinoma.     Antibiotics: Zosyn  HPI: Victor Dunlap is a 46 y.o. male with a history of tobacco abuse, Aortic  insufficiency (bicuspid valve) who presented to ED with right posterior shoulder discomfort.  Work up revealed bilateral pulmonary nodules.  He is known to have had a CXR with bilateral upper lobe pulmonary nodules in the past (november of 2012) but did not return for follow up.  He denies any recent fever, chills, weight loss, night sweats, though did recently have URI symptoms that have resolved.  He feels that he is at his baseline.     Review of Systems: Pertinent items are noted in HPI.  Past Medical History  Diagnosis Date  . Aortic insufficiency   . Hypercholesteremia   . DDD (degenerative disc disease)   . Heart murmur   . Pulmonary nodules/lesions, multiple 03/06/2012    "right" (03/06/2012)  . Bicuspid aortic valve     History  Substance Use Topics  . Smoking status: Current Every Day Smoker -- 0.2 packs/day for 22 years    Types: Cigarettes  . Smokeless tobacco: Never Used     Comment: 03/06/2012 "restarted in July after I had quit for 1 yr"  . Alcohol Use: 6.0 oz/week    12 drink(s) per week     Comment: 03/06/2012 "drink 4 times/wk"    History reviewed. No pertinent family history. No Known Allergies  OBJECTIVE: Blood pressure 125/57, pulse 58, temperature 98 F (36.7 C), temperature source Oral, resp. rate 16, height 6\' 4"  (1.93 m), weight 224 lb 13.9 oz (102 kg), SpO2 98.00%. General: Awake, alert, nad Skin: no rashes Lungs: CTA B Cor: RRR without m/r/g appreciated Abdomen: soft, ntnd, +bs   Microbiology: Recent Results (from the past 240 hour(s))  CULTURE, BLOOD (ROUTINE X 2)     Status: Normal (Preliminary result)   Collection Time   03/06/12  2:20 PM      Component Value Range Status Comment   Specimen Description BLOOD ARM RIGHT   Final    Special Requests BOTTLES DRAWN AEROBIC AND ANAEROBIC 10CC   Final    Culture  Setup Time 03/06/2012 21:59   Final    Culture     Final    Value:        BLOOD CULTURE RECEIVED NO GROWTH TO DATE CULTURE WILL BE  HELD FOR 5 DAYS BEFORE ISSUING A FINAL NEGATIVE REPORT   Report Status PENDING   Incomplete   CULTURE, BLOOD (ROUTINE X 2)     Status: Normal (Preliminary result)   Collection Time   03/06/12  2:30 PM      Component Value Range Status Comment   Specimen Description BLOOD ARM LEFT   Final    Special Requests BOTTLES DRAWN AEROBIC AND ANAEROBIC 10CC   Final    Culture  Setup Time 03/06/2012 21:59   Final    Culture     Final    Value: GRAM POSITIVE COCCI IN CLUSTERS     Note: Gram Stain Report Called to,Read Back By and Verified With: VASSIE CAIN @ 1453 03/07/12 BY KRAWS   Report Status PENDING   Incomplete   CULTURE, RESPIRATORY     Status: Normal (Preliminary result)   Collection Time   03/06/12  3:14 PM      Component Value Range Status Comment   Specimen Description SPUTUM   Final    Special Requests NONE   Final    Gram Stain     Final    Value: NO WBC SEEN     FEW SQUAMOUS EPITHELIAL CELLS PRESENT     FEW GRAM NEGATIVE RODS     FEW GRAM NEGATIVE COCCOBACILLI     FEW GRAM POSITIVE COCCI IN PAIRS   Culture Culture reincubated for better growth   Final    Report Status PENDING   Incomplete   CULTURE, EXPECTORATED SPUTUM-ASSESSMENT     Status: Normal   Collection Time   03/07/12 11:41 AM      Component Value Range Status Comment   Specimen Description SPU   Final    Special Requests Normal   Final    Sputum evaluation     Final    Value: MICROSCOPIC FINDINGS SUGGEST THAT THIS SPECIMEN IS NOT REPRESENTATIVE OF LOWER RESPIRATORY SECRETIONS. PLEASE RECOLLECT.     CALLED TO [facility] 03/07/12 1220 BY BSLADE   Report Status 03/07/2012 FINAL   Final     Staci Righter, MD [facility] [phone] pager  [phone] cell 03/07/2012, 4:09 PM

## 2012-03-08 LAB — CULTURE, RESPIRATORY W GRAM STAIN
Culture: NORMAL
Gram Stain: NONE SEEN

## 2012-03-08 LAB — CULTURE, BLOOD (ROUTINE X 2)

## 2012-03-08 NOTE — Progress Notes (Signed)
Discussed discharge instructions with patient.  He verbalized understanding of all instructions and follow-up procedure on Tuesday.  IV D/C'd.  No complaints at this time.  Pt refused wheelchair.  He ambulated out where friends and family were waiting.

## 2012-03-08 NOTE — Discharge Summary (Signed)
  Physician Discharge Summary  Patient ID: Victor Dunlap MRN: [id number] DOB/AGE: 47/04/1965 47 y.o.  Admit date: 03/06/2012 Discharge date: 03/08/2012  Admission Diagnoses:  Discharge Diagnoses:  Principal Problem:  *Pulmonary nodules/lesions, multiple Active Problems:  Bicuspid aortic valve      Discharged Condition: good  Hospital Course: Victor Dunlap is a 46 yo admitted for uri sxs and chest pain.  Evaluation showed pulmonary nodules.  He was given zosyn briefly. ID and Pulm. Were consulted.   His abx were stopped and he was deemed stable for discharge home.   He is to have a bronchoscopy in two days as an outpatient.   Consults: pulmonary/intensive care  Significant Diagnostic Studies: ct chest  Treatments: iv zosyn  Discharge Exam:  Blood pressure 100/53, pulse 69, temperature 98.2 F (36.8 C), temperature source Oral, resp. rate 16, height 6\' 4"  (1.93 m), weight 102 kg (224 lb 13.9 oz), SpO2 100.00%.  Physical Exam:wd, wn male. Appears well.   Lungs cta bilat. No w/r/r  .  Heart is rrr with no m/r/g. abd soft,not. No edema.  Disposition:discharge to home  Discharge Orders    Future Appointments: Provider: Department: Dept Phone: Center:   03/10/2012 2:00 PM [facility] RESPIRATORY THERAPY [phone] None     Future Orders Please Complete By Expires   Diet - low sodium heart healthy      Increase activity slowly          Medication List     As of 03/08/2012  9:32 AM              TAKE these medications         lovastatin 20 MG tablet   Commonly known as: MEVACOR   Take 20 mg by mouth daily.     Low dose tylenol or asa can be used for aches and pains.     SignedRodrigo Ran A 03/08/2012, 9:32 AM

## 2012-03-10 ENCOUNTER — Encounter (HOSPITAL_COMMUNITY): Payer: Self-pay | Admitting: Radiology

## 2012-03-10 ENCOUNTER — Encounter (HOSPITAL_COMMUNITY)
Admission: RE | Disposition: A | Discharge status: Home / Self Care | Payer: Self-pay | Source: Ambulatory Visit | Attending: Pulmonary Disease

## 2012-03-10 ENCOUNTER — Ambulatory Visit (HOSPITAL_COMMUNITY)
Admission: RE | Admit: 2012-03-10 | Discharge: 2012-03-10 | Disposition: A | Discharge status: Home / Self Care | Payer: BC Managed Care – PPO | Source: Ambulatory Visit | Attending: Pulmonary Disease | Admitting: Pulmonary Disease

## 2012-03-10 ENCOUNTER — Inpatient Hospital Stay (HOSPITAL_COMMUNITY)
Admit: 2012-03-10 | Discharge: 2012-03-10 | Disposition: A | Discharge status: Home / Self Care | Payer: BC Managed Care – PPO

## 2012-03-10 ENCOUNTER — Ambulatory Visit (HOSPITAL_COMMUNITY)
Admission: RE | Admit: 2012-03-10 | Payer: BC Managed Care – PPO | Source: Ambulatory Visit | Admitting: Pulmonary Disease

## 2012-03-10 ENCOUNTER — Encounter (HOSPITAL_COMMUNITY): Admission: RE | Payer: Self-pay | Source: Ambulatory Visit

## 2012-03-10 DIAGNOSIS — R918 Other nonspecific abnormal finding of lung field: Secondary | ICD-10-CM | POA: Insufficient documentation

## 2012-03-10 DIAGNOSIS — B49 Unspecified mycosis: Secondary | ICD-10-CM

## 2012-03-10 DIAGNOSIS — Z720 Tobacco use: Secondary | ICD-10-CM

## 2012-03-10 DIAGNOSIS — F17200 Nicotine dependence, unspecified, uncomplicated: Secondary | ICD-10-CM

## 2012-03-10 DIAGNOSIS — J189 Pneumonia, unspecified organism: Secondary | ICD-10-CM

## 2012-03-10 HISTORY — PX: VIDEO BRONCHOSCOPY: SHX5072

## 2012-03-10 LAB — CULTURE, RESPIRATORY W GRAM STAIN: Culture: NORMAL

## 2012-03-10 LAB — QUANTIFERON TB GOLD ASSAY (BLOOD): Interferon Gamma Release Assay: NEGATIVE

## 2012-03-10 SURGERY — VIDEO BRONCHOSCOPY WITHOUT FLUORO
Anesthesia: Moderate Sedation | Laterality: Bilateral

## 2012-03-10 SURGERY — VIDEO BRONCHOSCOPY WITHOUT FLUORO
Anesthesia: Moderate Sedation

## 2012-03-10 MED ORDER — FENTANYL CITRATE 0.05 MG/ML IJ SOLN
INTRAMUSCULAR | Status: AC
  Filled 2012-03-10: qty 4

## 2012-03-10 MED ORDER — BUTAMBEN-TETRACAINE-BENZOCAINE 2-2-14 % EX AERO: 1.0000 | INHALATION_SPRAY | Freq: Once | CUTANEOUS | Status: DC

## 2012-03-10 MED ORDER — FENTANYL CITRATE 0.05 MG/ML IJ SOLN
INTRAMUSCULAR | Status: AC
  Filled 2012-03-10: qty 2

## 2012-03-10 MED ORDER — LIDOCAINE HCL (PF) 1 % IJ SOLN: INTRAMUSCULAR | Status: DC | PRN

## 2012-03-10 MED ORDER — PHENYLEPHRINE HCL 10 MG/ML IJ SOLN
INTRAMUSCULAR | Status: DC | PRN
  Administered 2012-03-10: .25 mL

## 2012-03-10 MED ORDER — LIDOCAINE HCL 2 % EX GEL: Freq: Once | CUTANEOUS | Status: DC

## 2012-03-10 MED ORDER — MIDAZOLAM HCL 10 MG/2ML IJ SOLN
INTRAMUSCULAR | Status: DC | PRN
  Administered 2012-03-10: .5 mg via INTRAVENOUS
  Administered 2012-03-10: 2 mg via INTRAVENOUS
  Administered 2012-03-10: 1 mg via INTRAVENOUS
  Administered 2012-03-10: .5 mg via INTRAVENOUS
  Administered 2012-03-10 (×2): 1 mg via INTRAVENOUS

## 2012-03-10 MED ORDER — LIDOCAINE HCL 2 % EX GEL
CUTANEOUS | Status: DC | PRN
  Administered 2012-03-10: 2

## 2012-03-10 MED ORDER — PHENYLEPHRINE HCL 0.25 % NA SOLN: 1.0000 | Freq: Four times a day (QID) | NASAL | Status: DC | PRN

## 2012-03-10 MED ORDER — LIDOCAINE HCL (PF) 1 % IJ SOLN
INTRAMUSCULAR | Status: DC | PRN
  Administered 2012-03-10: 6 mL

## 2012-03-10 MED ORDER — SODIUM CHLORIDE 0.9 % IV SOLN
INTRAVENOUS | Status: DC
  Administered 2012-03-10: 250 mL via INTRAVENOUS

## 2012-03-10 MED ORDER — FENTANYL CITRATE 0.05 MG/ML IJ SOLN
INTRAMUSCULAR | Status: DC | PRN
  Administered 2012-03-10 (×2): 50 ug via INTRAVENOUS
  Administered 2012-03-10 (×2): 25 ug via INTRAVENOUS
  Administered 2012-03-10 (×3): 50 ug via INTRAVENOUS

## 2012-03-10 MED ORDER — MIDAZOLAM HCL 5 MG/ML IJ SOLN
INTRAMUSCULAR | Status: AC
  Filled 2012-03-10: qty 4

## 2012-03-10 NOTE — Op Note (Signed)
Name:  Victor Dunlap MRN:  [id number] DOB:  1966-03-23  PROCEDURE NOTE  Procedure(s): Flexible bronchoscopy [phone]) Bronchial alveolar lavage [phone]) of the LUL and RUL (2 samples).  Indications:  Biapical infiltrate..  Consent:  Procedure, benefits, risks and alternatives discussed.  Questions answered.  Consent obtained.  Anesthesia:  General endotracheal.  Procedure summary:  Appropriate equipment was assembled.  The patient was brought to the operating room and identified as Victor Dunlap.  Safety timeout was performed. The patient was placed supine on the operating table, airway established and general anesthesia administered by Anesthesia team.   After the appropriate level of anesthesia was assured, flexible video bronchoscope was lubricated and inserted through the endotracheal tube.  Total of 6 mL of 1% Lidocaine were administered through the bronchoscope to augment general anesthesia.  Airway examination was performed bilaterally to subsegmental level.  Minimal clear secretions were noted, mucosa appeared normal and no endobronchial lesions were identified.  Bronchial alveolar lavage of the right upper lobe was performed with 60 mL of normal saline and return of 10 mL of fluid, after which the bronchoscope was withdrawn.  Bronchial alveolar lavage of the left upper lobe was performed with 60 mL of normal saline and return of 15 mL of fluid, after which the bronchoscope was withdrawn.   Specimens sent: See orders.  Complications:  No immediate complications were noted.  Hemodynamic parameters and oxygenation remained stable throughout the procedure.  Estimated blood loss:  Less then 5 mL.  Alyson Reedy, M.D. [facility] Pulmonary/Critical Care Medicine. Pager: [phone]. After hours pager: [phone].

## 2012-03-11 ENCOUNTER — Encounter (HOSPITAL_COMMUNITY): Payer: Self-pay | Admitting: Pulmonary Disease

## 2012-03-11 ENCOUNTER — Other Ambulatory Visit: Payer: Self-pay | Admitting: Pulmonary Disease

## 2012-03-11 LAB — PNEUMOCYSTIS JIROVECI SMEAR BY DFA
Pneumocystis jiroveci Ag: NEGATIVE
Pneumocystis jiroveci Ag: NEGATIVE

## 2012-03-11 LAB — HISTOPLASMA ANTIGEN, URINE: Histoplasma Antigen, urine: <0.5 ng/mL

## 2012-03-12 LAB — CULTURE, BLOOD (ROUTINE X 2): Culture: NO GROWTH

## 2012-03-13 LAB — CULTURE, BAL-QUANTITATIVE W GRAM STAIN
Colony Count: 15000
Colony Count: 80000

## 2012-03-13 LAB — MISCELLANEOUS TEST

## 2012-03-14 LAB — FUNGAL ANTIBODIES PANEL, ID-BLOOD
Aspergillus Flavus Antibodies: NEGATIVE
Aspergillus Niger Antibodies: NEGATIVE
Aspergillus fumigatus: NEGATIVE
Blastomyces Abs, Qn, DID: NEGATIVE
Coccidioides Antibody ID: NEGATIVE
Histoplasma Ab, Immunodiffusion: NEGATIVE

## 2012-03-17 ENCOUNTER — Inpatient Hospital Stay: Payer: BC Managed Care – PPO | Admitting: Internal Medicine

## 2012-03-19 ENCOUNTER — Encounter: Payer: Self-pay | Admitting: Pulmonary Disease

## 2012-03-19 ENCOUNTER — Ambulatory Visit (INDEPENDENT_AMBULATORY_CARE_PROVIDER_SITE_OTHER): Payer: BC Managed Care – PPO | Admitting: Pulmonary Disease

## 2012-03-19 VITALS — BP 132/62 | HR 85 | Temp 98.2°F | Ht 76.0 in | Wt 222.8 lb

## 2012-03-19 DIAGNOSIS — R918 Other nonspecific abnormal finding of lung field: Secondary | ICD-10-CM

## 2012-03-19 NOTE — Patient Instructions (Signed)
Will schedule CT chest for end of January 2014 Will get copy of chest xray reports from Dr. Silvano Rusk office Follow up after CT chest done in January 2014

## 2012-03-19 NOTE — Progress Notes (Signed)
Chief Complaint  Patient presents with  . hfu    here to discuss bronch. has good and bad days. yesterday he was coughing up brown phlem. today he is not coughing up anything. feels fine today    History of Present Illness: Victor Dunlap is a 46 y.o. male former smoker with pulmonary nodules.  He presented to the emergency room on Thanksgiving day with right shoulder/back pain.  He had a chest xray which showed b/l upper lobe masses.  These were confirmed on CT chest.  He was seen in hospital as consult by Dr. Molli Knock with pulmonary service.  He was treated initially for a pneumonia.  He was evaluated by Dr. Luciana Axe with ID, and antibiotics were stopped as bacterial infection seemed unlikely.  There was concern for atypical infections.    As a result he underwent bronchoscopy with Dr. Molli Knock on 03/10/12.  Culture results and cytology from BAL have been negative to date.  He is not longer having chest/back pain.  He denies fever.  He gets occasional cough, but denies sputum or hemoptysis. He denies wheezing, skin rash, sweats, or gland swelling.  He is no longer smoking.  He denies dyspnea, and exercises on a regular basis w/o difficulty.   TESTS: CT chest 03/06/12>>2.8 x 2.2 cm RUL mass, LUL 2.2 x 2.0 cm mass, 3 mm RUL nodule, 5 mm LUL nodule Echo 03/07/12>>mild LVH, EF 55 to 60%, mod AR, mod LA dilation Quantiferon gold assay 03/07/12>>Negative Fungal antibody panel 03/07/12>>Negative for Aspergillus, Blastomyces, Coccidioides, Histoplasma Histoplasma urine Ag 03/07/12>> less than0.5 ng/ml (normal) HIV 03/07/12>>Non reactive Bronchoscopy 03/10/12>>Benign reactive/reparative changes, negative Pneumocystis smear  Past Medical History  Diagnosis Date  . Aortic insufficiency   . Hypercholesteremia   . DDD (degenerative disc disease)   . Heart murmur   . Pulmonary nodules/lesions, multiple 03/06/2012    "right" (03/06/2012)  . Bicuspid aortic valve     Past Surgical History  Procedure  Date  . Tonsillectomy 1970's  . Hernia repair ~ 1968    "umbilical" (03/06/2012)  . Video bronchoscopy 03/10/2012    Procedure: VIDEO BRONCHOSCOPY WITHOUT FLUORO;  Surgeon: Alyson Reedy, MD;  Location: [facility] ENDOSCOPY;  Service: Endoscopy;  Laterality: Bilateral;    Outpatient Encounter Prescriptions as of 03/19/2012  Medication Sig Dispense Refill  . lovastatin (MEVACOR) 20 MG tablet Take 20 mg by mouth daily.        No Known Allergies  Physical Exam:  Filed Vitals:   03/19/12 1604 03/19/12 1605  BP:  132/62  Pulse:  85  Temp: 98.2 F (36.8 C)   TempSrc: Oral   Height: 6\' 4"  (1.93 m)   Weight: 222 lb 12.8 oz (101.061 kg)   SpO2:  97%    Body mass index is 27.12 kg/(m^2).   Wt Readings from Last 2 Encounters:  03/19/12 222 lb 12.8 oz (101.061 kg)  03/06/12 224 lb 13.9 oz (102 kg)     General - No distress ENT - No sinus tenderness, no oral exudate, no LAN Cardiac - s1s2 regular, 2/6 SM Chest - No wheeze/rales/dullness Back - No focal tenderness Abd - Soft, non-tender Ext - No edema Neuro - Normal strength Skin - No rashes Psych - normal mood, and behavior   Assessment/Plan:  Coralyn Helling, MD [place] [facility]/Critical Care/Sleep Pager:  [phone] 03/19/2012, 4:10 PM

## 2012-03-19 NOTE — Assessment & Plan Note (Signed)
He has persistent, bilateral upper lobe infiltrates.  These apparently have been present on chest xray at least for one year.  It is unusual that he has almost symmetric lesions in both lungs.  He is relatively symptom free at present.  His lab work up, culture results, and bronchoscopy cytology results are negative/unrevealing to date.  Discussed option of proceeding with needle biopsy with interventional radiology versus waiting for final results of AFB and fungal cultures and repeating CT chest at end of January 2014.  He has opted to have follow up CT chest in 6 weeks.  Will also get copies of chest xray reports from Dr. Silvano Rusk office.

## 2012-03-31 LAB — FUNGUS CULTURE W SMEAR: Fungal Smear: NONE SEEN

## 2012-04-07 LAB — FUNGUS CULTURE W SMEAR
Fungal Smear: NONE SEEN
Fungal Smear: NONE SEEN

## 2012-04-17 LAB — AFB CULTURE WITH SMEAR (NOT AT ARMC): Acid Fast Smear: NONE SEEN

## 2012-04-22 LAB — AFB CULTURE WITH SMEAR (NOT AT ARMC)
Acid Fast Smear: NONE SEEN
Acid Fast Smear: NONE SEEN

## 2012-05-07 ENCOUNTER — Other Ambulatory Visit: Payer: BC Managed Care – PPO

## 2012-05-08 ENCOUNTER — Ambulatory Visit: Payer: BC Managed Care – PPO | Admitting: Pulmonary Disease

## 2012-05-24 ENCOUNTER — Other Ambulatory Visit: Payer: Self-pay

## 2012-07-10 ENCOUNTER — Telehealth: Payer: Self-pay | Admitting: Cardiology

## 2012-07-10 NOTE — Telephone Encounter (Signed)
Follow Up    Pt calling in wanting to do a follow up ECHO. Would like to speak to nurse.

## 2012-07-10 NOTE — Telephone Encounter (Signed)
Pt needed to know when he had his last Echo  - advised 02/27/2012.

## 2012-07-31 ENCOUNTER — Other Ambulatory Visit: Payer: Self-pay | Admitting: Dermatology

## 2012-09-29 ENCOUNTER — Telehealth: Payer: Self-pay | Admitting: Cardiology

## 2012-09-29 DIAGNOSIS — R918 Other nonspecific abnormal finding of lung field: Secondary | ICD-10-CM

## 2012-09-29 NOTE — Telephone Encounter (Signed)
Will forward to MD for recommendation.

## 2012-09-29 NOTE — Telephone Encounter (Signed)
New Problem  Pt is wanting a referral to a pulmonologist. He said he use to see one in [place] but he wants one closer.

## 2012-09-30 NOTE — Telephone Encounter (Signed)
Refer to pulmonary clinic.  No specific instruction as to which provider.

## 2012-10-01 ENCOUNTER — Telehealth: Payer: Self-pay | Admitting: Cardiology

## 2012-10-01 NOTE — Telephone Encounter (Signed)
Noted. Thank you Sharon

## 2012-10-01 NOTE — Telephone Encounter (Signed)
Referral placed and pt will be called with an appt.

## 2012-10-23 ENCOUNTER — Ambulatory Visit: Payer: BC Managed Care – PPO | Admitting: Pulmonary Disease

## 2013-02-12 ENCOUNTER — Other Ambulatory Visit: Payer: Self-pay

## 2014-02-02 IMAGING — CT CT ANGIO CHEST
2 of 6 series · 18 of 46 positions shown · IV contrast (APPLIED)
Comparison: Chest radiograph same date

CLINICAL DATA: Chest pain, airspace opacities on chest radiography
performed today

CT ANGIOGRAPHY CHEST
TECHNIQUE: Multidetector CT imaging of the chest using the
standard protocol during bolus administration of intravenous
contrast. Multiplanar reconstructed images including MIPs were
obtained and reviewed to evaluate the vascular anatomy.
Contrast: 100mL OMNIPAQUE IOHEXOL 350 MG/ML SOLN

[Series 6: pulm embolism 1.0 b25f thin · axial · 0.70mm/px · z∈[+984,+1284]mm · 15 of 331 slices shown]
[im 15/331  lung]
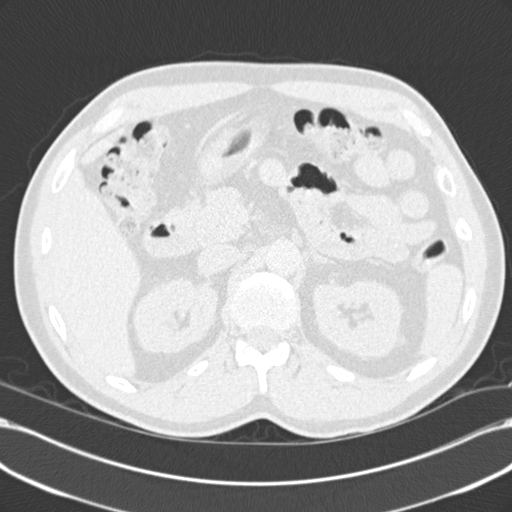
[im 44/331  soft-tissue]
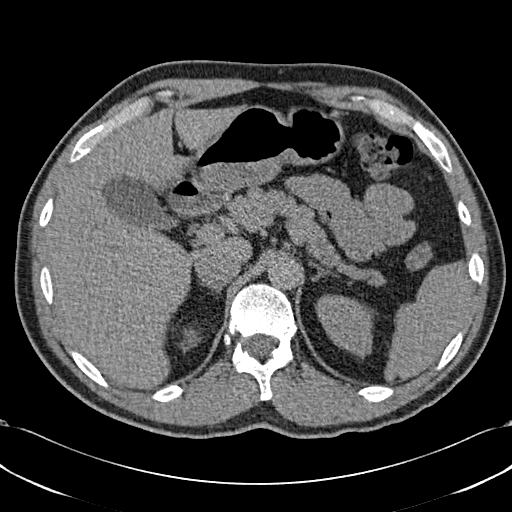
[im 58/331  lung]
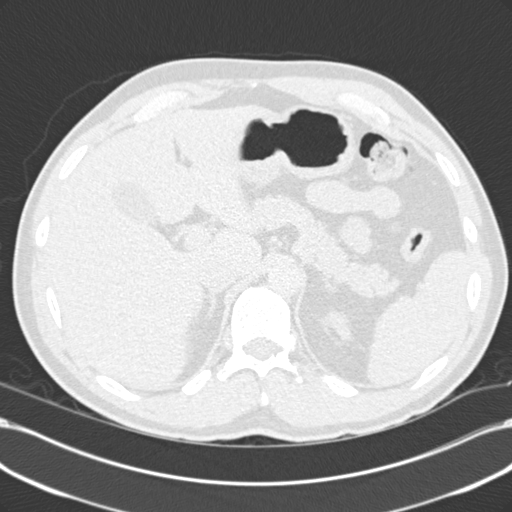
[im 87/331  soft-tissue]
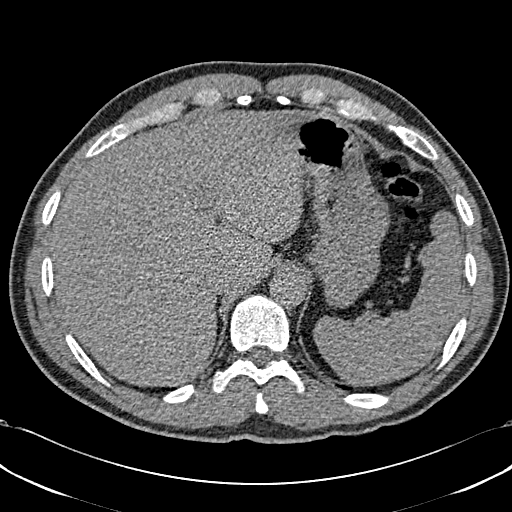
[im 101/331  lung]
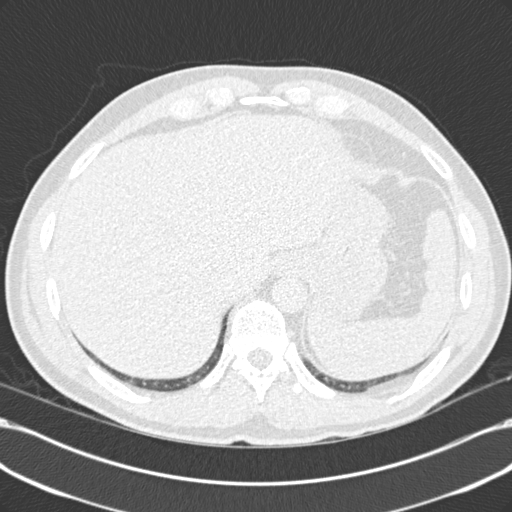
[im 130/331  soft-tissue]
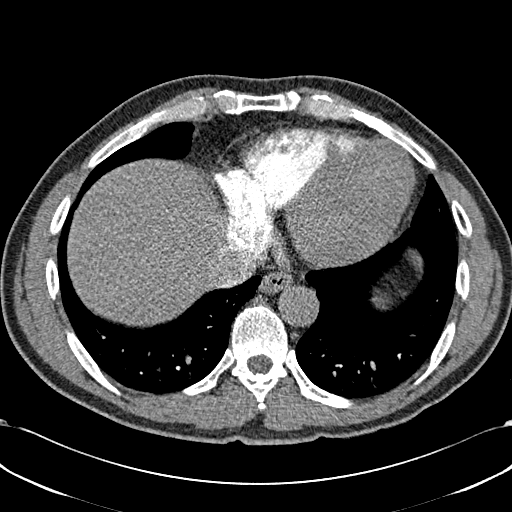
[im 144/331  lung]
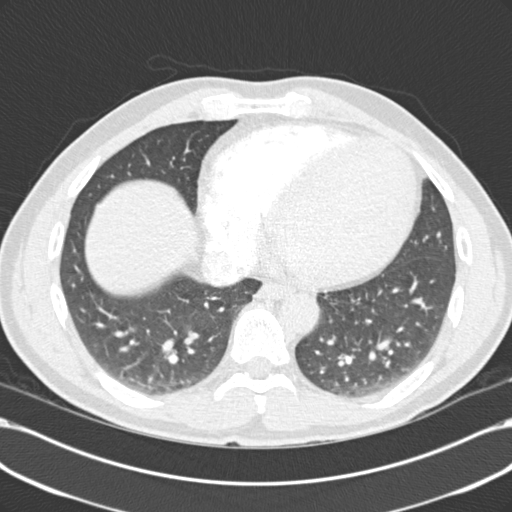
[im 173/331  soft-tissue]
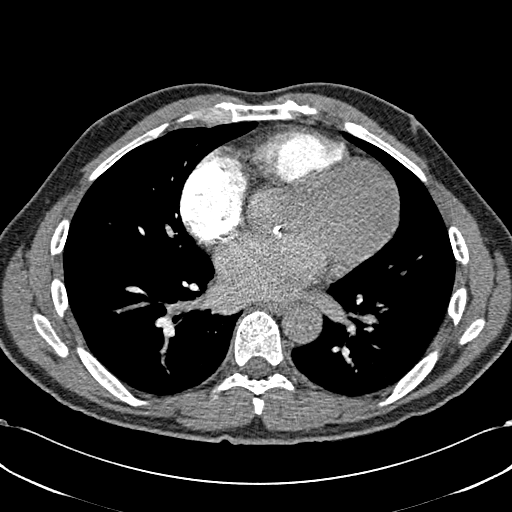
[im 187/331  lung]
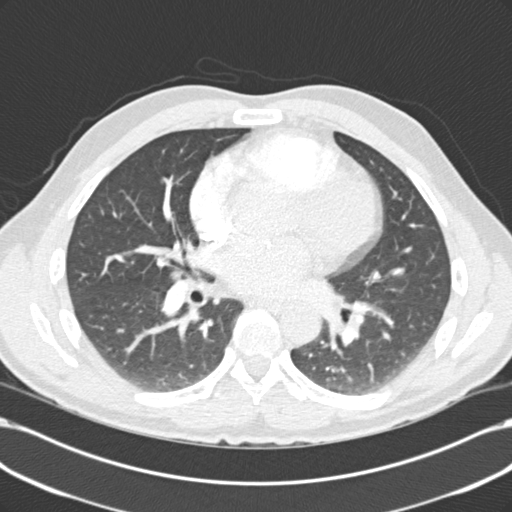
[im 201/331  soft-tissue]
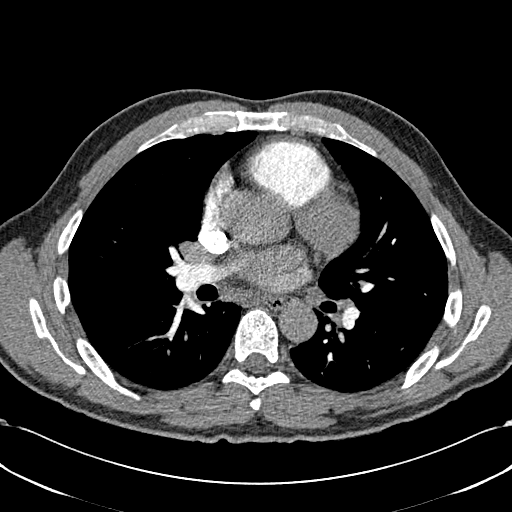
[im 230/331  lung]
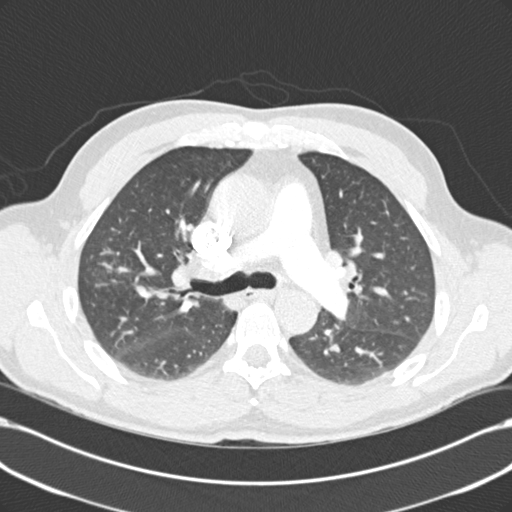
[im 244/331  soft-tissue]
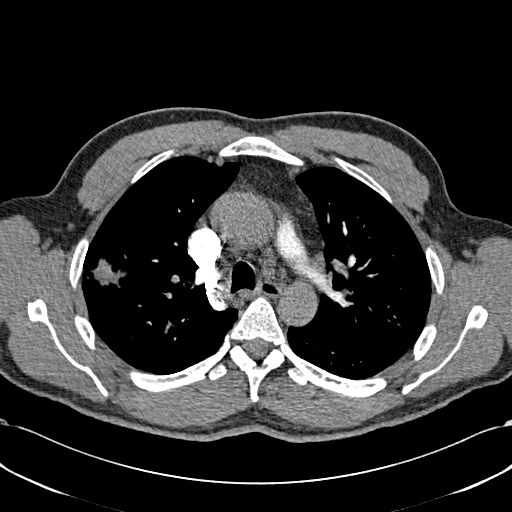
[im 273/331  lung]
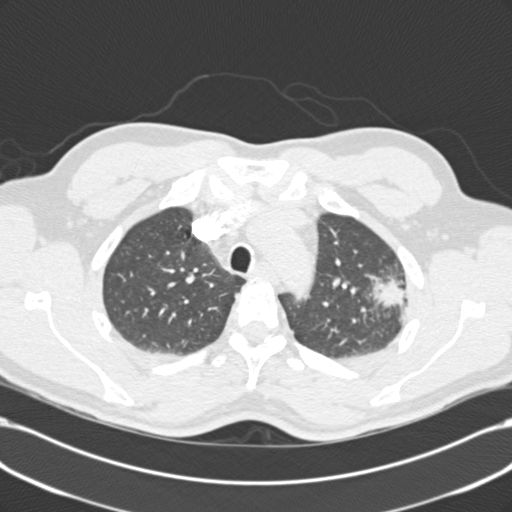
[im 287/331  soft-tissue]
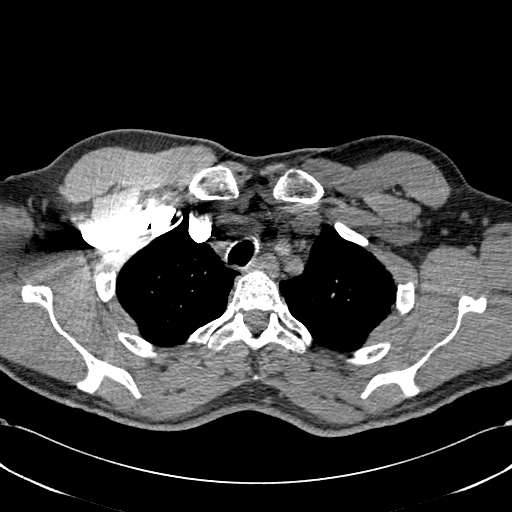
[im 316/331  lung]
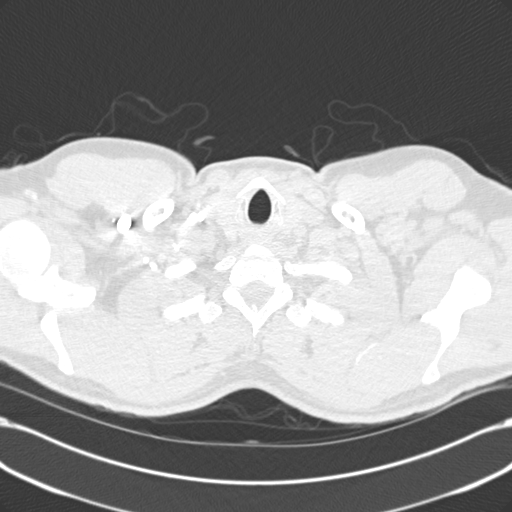

[Series 602: cor · coronal · 0.70mm/px · 3 of 126 slices shown]
[im 32/126  soft-tissue]
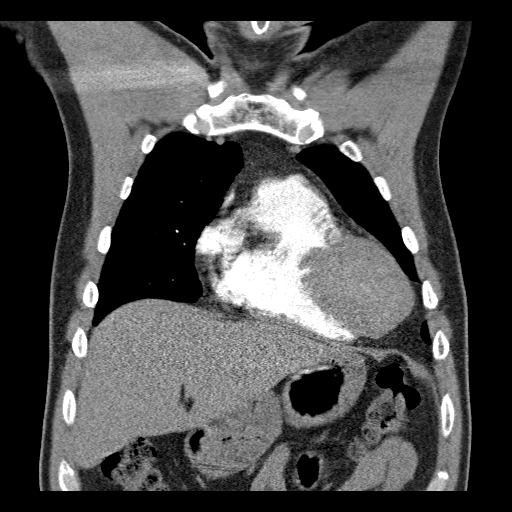
[im 63/126  soft-tissue]
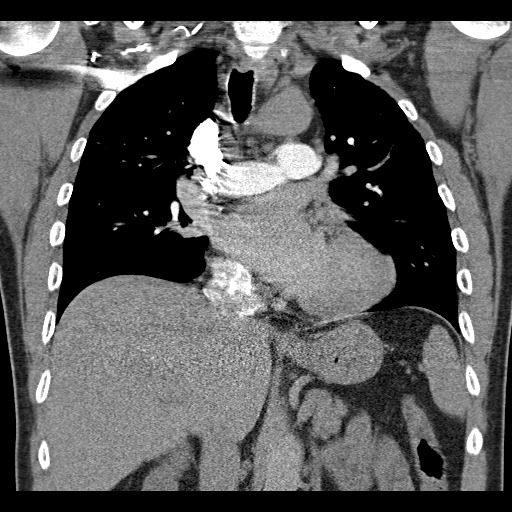
[im 94/126  soft-tissue]
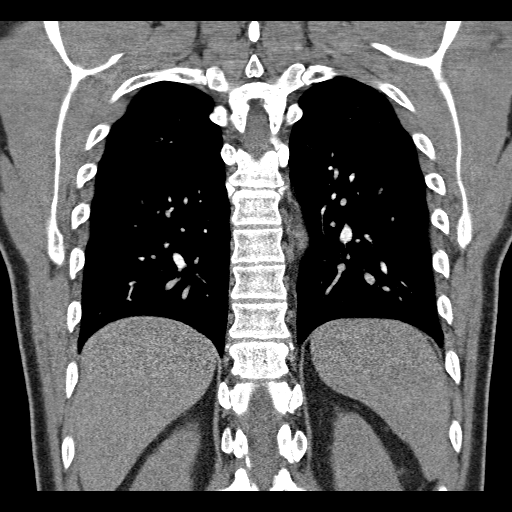

[18 of 46 positions shown; findings below may reference images not displayed]

FINDINGS: There are a few patchy irregular bilateral upper lobe
airspace opacities, corresponding to findings on today's prior
exam.  Right upper lobe mass like opacity measures 2.8 x 2.2 cm
image 30 and left upper lobe airspace opacity measures 2.2 x 2.0 cm
image 21.  Suggestion of internal air bronchogram formation is
noted, particularly on the left.  3 mm right upper lobe pulmonary
nodule also identified image 41.  Left upper lobe pulmonary nodule
identified measuring 5 mm image 40.  Central airways are patent.
No pleural effusion.

The study is of adequate technical quality for evaluation for
pulmonary embolism up to and including the 3rd order pulmonary
arteries.  No filling defect is identified up to and including the
3rd order pulmonary arteries to suggest acute pulmonary embolism.
Great vessels are normal in caliber.  Heart size is upper limits of
normal.  No lymphadenopathy; small AP window lymph nodes are
identified, largest 7 mm image 29 and pretracheal node measuring 9
mm image 31.

Incomplete imaging of the upper abdomen demonstrates normal-
appearing adrenal gland but incomplete visualization of the left
adrenal gland.

No acute osseous finding.
IMPRESSION: Bilateral pulmonary nodules and bilateral upper lobe irregular
pulmonary airspace opacities with suggestion of internal air
bronchogram formation.  This feature and the relatively hazy border
of the lesions may be seen with fungal infection, other infectious
etiologies, septic emboli, or less likely hemorrhage.  No CT
evidence for acute pulmonary arterial embolism up to the 3rd order
pulmonary arteries is identified.

Synchronous bronchogenic malignancy is less likely but possible.
Follow-up to radiographic resolution as previously described is
recommended.

## 2014-02-02 IMAGING — CR DG CHEST 2V
2 series · 2 of 2 positions shown · non-contrast
Comparison: None.

CLINICAL DATA: Shortness of breath, chest pain, left infrascapular
chest pain

CHEST - 2 VIEW

[w chest pa]
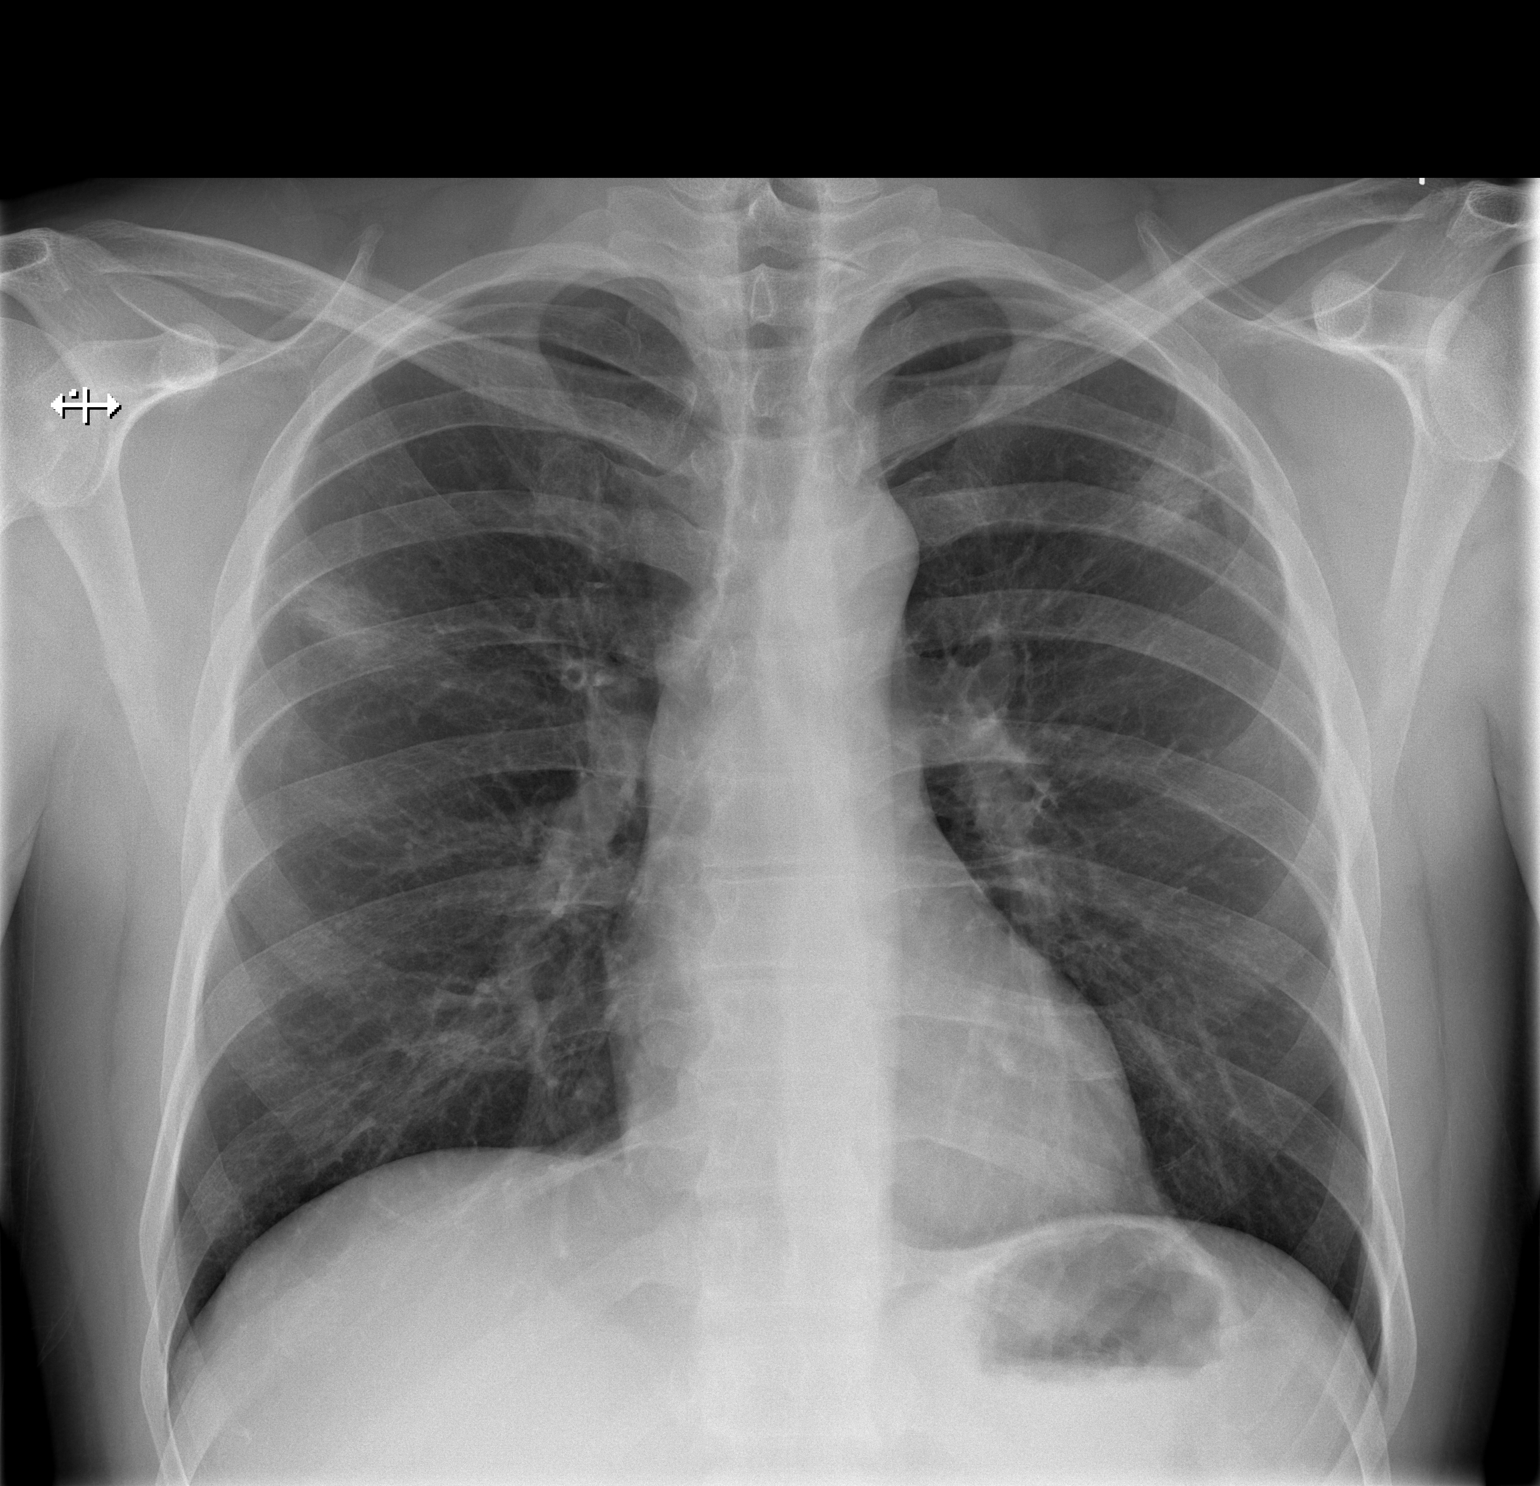

[w chest lat]
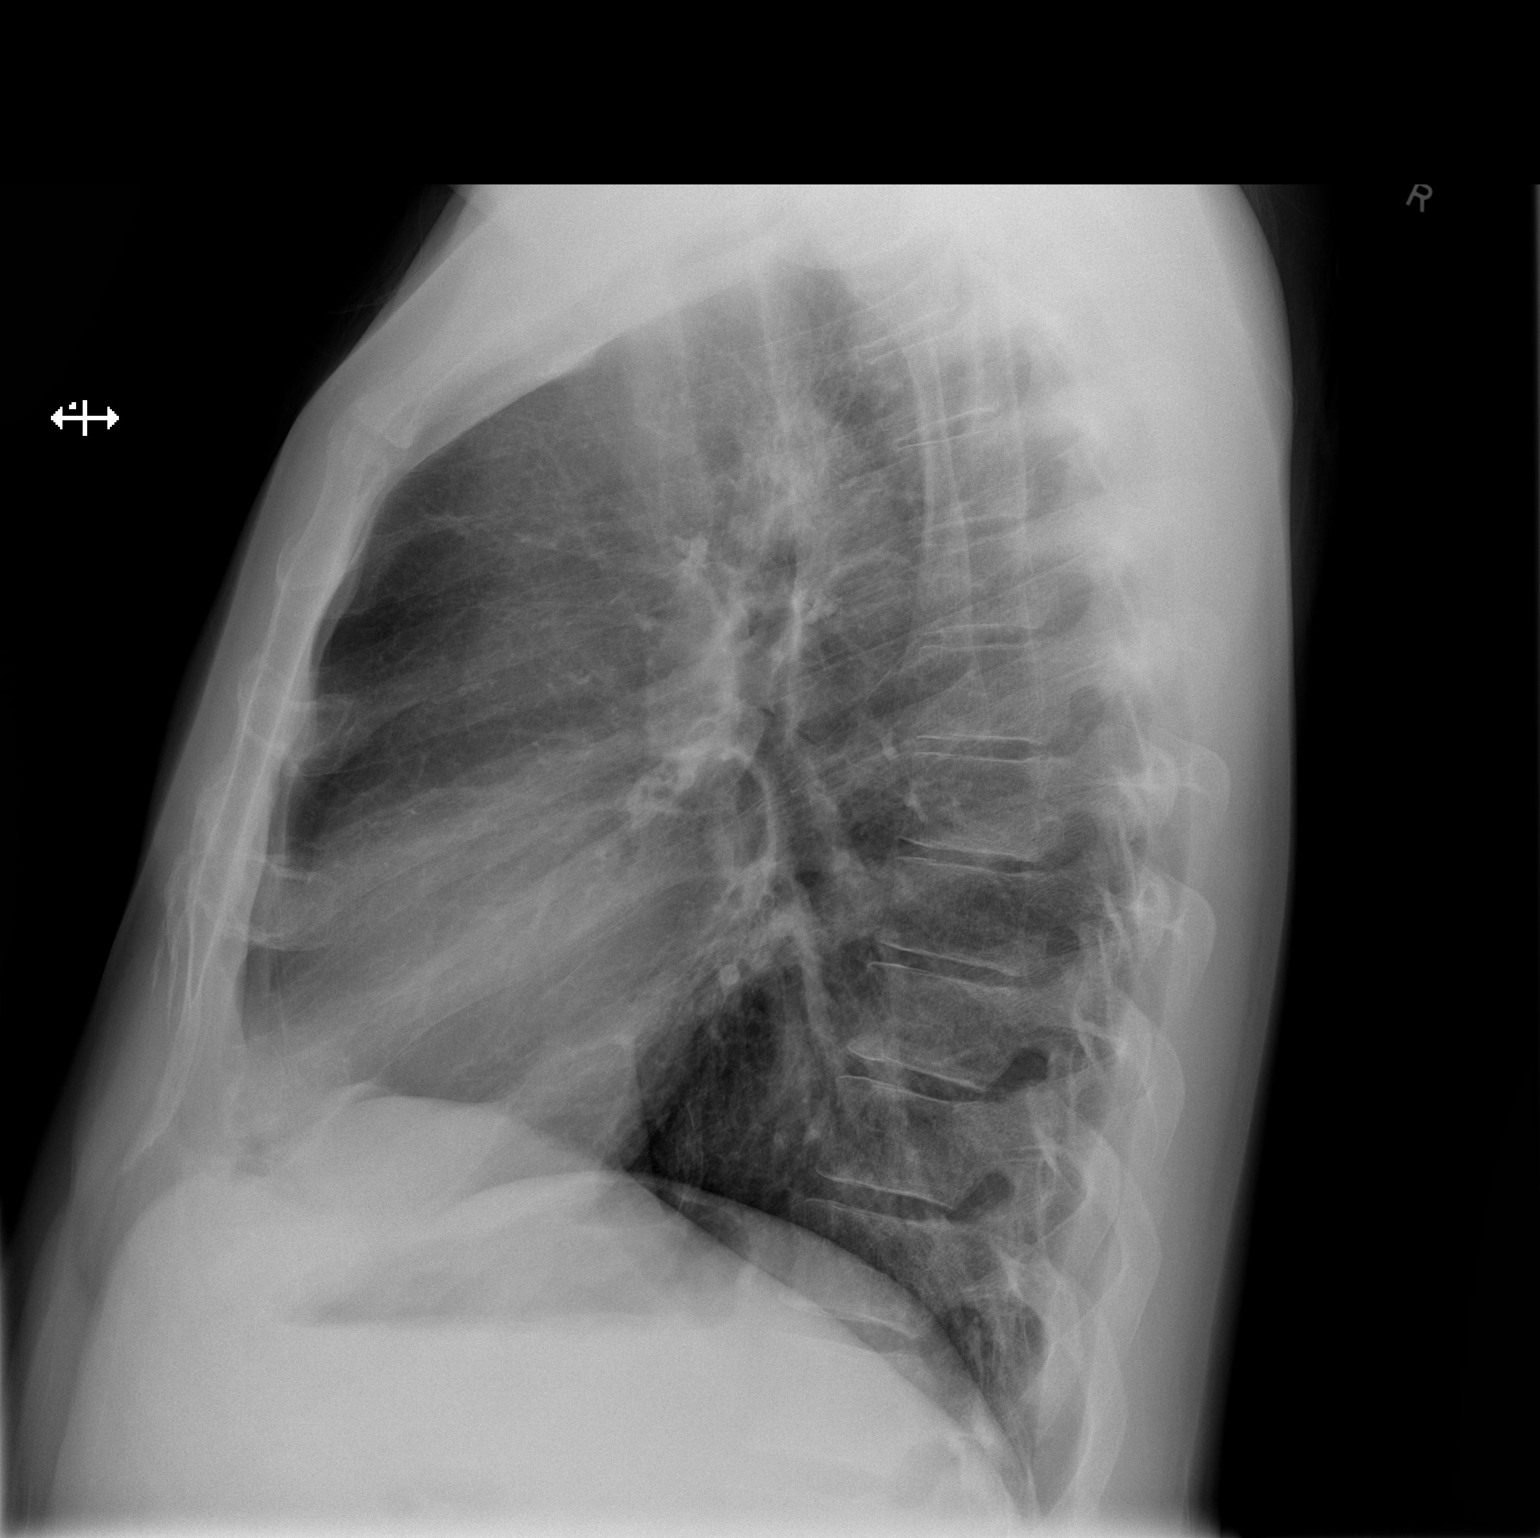

[2 of 2 positions shown; findings below may reference images not displayed]

FINDINGS: There are patchy areas of ill-defined airspace opacity
projecting over the bilateral upper lobe, right anterior third rib
region and left anterior second rib region, respectively.  No
pleural effusion.  No cardiac enlargement.  No acute osseous
finding.
IMPRESSION: Bilateral upper lobe patchy airspace opacities.  This finding is
nonspecific and could represent alveolar filling processes such as
pneumonia, aspiration, hemorrhage, much less likely protein or
cells.  Bilateral synchronous upper lobe bronchogenic carcinoma
would be less likely but possible given the history of smoking.
Follow-up radiographic resolution after treatment is recommended in
4-6 weeks with PA and lateral chest radiographs.  If the finding
does not resolve at that time, chest CT would be recommended.

## 2014-09-08 ENCOUNTER — Ambulatory Visit (INDEPENDENT_AMBULATORY_CARE_PROVIDER_SITE_OTHER): Payer: Managed Care, Other (non HMO) | Admitting: Cardiology

## 2014-09-08 ENCOUNTER — Encounter: Payer: Self-pay | Admitting: Cardiology

## 2014-09-08 VITALS — BP 130/60 | HR 79 | Ht 76.0 in | Wt 220.0 lb

## 2014-09-08 DIAGNOSIS — I351 Nonrheumatic aortic (valve) insufficiency: Secondary | ICD-10-CM | POA: Diagnosis not present

## 2014-09-08 DIAGNOSIS — E785 Hyperlipidemia, unspecified: Secondary | ICD-10-CM | POA: Diagnosis not present

## 2014-09-08 NOTE — Progress Notes (Signed)
Cardiology Office Note   Date:  09/08/2014   ID:  Kyra Leyland, DOB [birth year], MRN [id number]  PCP:  Donnajean Lopes, MD  Cardiologist:   Minus Breeding, MD   Chief Complaint  Patient presents with  . Aortic Insuffiency      History of Present Illness: Victor Dunlap is a 49 y.o. male who presents for evaluation of aortic insufficiency with a probable bicuspid aortic valve. Oral and is low is low EF well He was previously followed by Dr. Doreatha Lew.  I saw him last in 2012. He has a history bicuspid Ao Valve with AI.  His last echo was in 2013.  He had moderate to severe insufficiency with mild LVH. His LV end-diastolic dimension was 63 mm.    He presents for follow and is overdue for his one year visit.  He reports that he does well.  The patient denies any new symptoms such as chest discomfort, neck or arm discomfort. There has been no new shortness of breath, PND or orthopnea. There have been no reported palpitations, presyncope or syncope.  He exercises 3 to 4 times per week.  He did stop smoking for a while.  However, he is back to smoking now.  Past Medical History  Diagnosis Date  . Aortic insufficiency   . Hypercholesteremia   . DDD (degenerative disc disease)   . Sarcoid 03/06/2012    pulmonary nodules  . Bicuspid aortic valve     Past Surgical History  Procedure Laterality Date  . Tonsillectomy  1970's  . Hernia repair  ~ 5366    "umbilical" (44/06/4740)  . Video bronchoscopy  03/10/2012    Procedure: VIDEO BRONCHOSCOPY WITHOUT FLUORO;  Surgeon: Rush Farmer, MD;  Location: [facility] ENDOSCOPY;  Service: Endoscopy;  Laterality: Bilateral;     Current Outpatient Prescriptions  Medication Sig Dispense Refill  . lovastatin (MEVACOR) 20 MG tablet Take 20 mg by mouth daily.     No current facility-administered medications for this visit.    Allergies:   Review of patient's allergies indicates no known allergies.    Social History:  The patient  reports that he  has been smoking Cigarettes.  He has a 5.5 pack-year smoking history. He has never used smokeless tobacco. He reports that he drinks about 7.2 oz of alcohol per week. He reports that he does not use illicit drugs.   Family History:  The patient's is not significant for any disease.      ROS:  Please see the history of present illness.   Otherwise, review of systems are positive for none.   All other systems are reviewed and negative.    PHYSICAL EXAM: VS:  BP 130/60 mmHg  Pulse 79  Ht 6\' 4"  (1.93 m)  Wt 220 lb (99.791 kg)  BMI 26.79 kg/m2 , BMI Body mass index is 26.79 kg/(m^2). GENERAL:  Well appearing HEENT:  Pupils equal round and reactive, fundi not visualized, oral mucosa unremarkable NECK:  No jugular venous distention, waveform within normal limits, carotid upstroke brisk and symmetric, no bruits, no thyromegaly LYMPHATICS:  No cervical, inguinal adenopathy LUNGS:  Clear to auscultation bilaterally BACK:  No CVA tenderness CHEST:  Unremarkable HEART:  PMI not displaced or sustained,S1 and S2 within normal limits, no S3, no S4, no clicks, no rubs, 2 out of 6 apical systolic murmur radiating slightly to the axilla, 2/6 diastolic murmur ending in mid diastole and heard at the left third intercostal space murmurs ABD:  Flat, positive bowel  sounds normal in frequency in pitch, no bruits, no rebound, no guarding, no midline pulsatile mass, no hepatomegaly, no splenomegaly EXT:  2 plus pulses throughout, no edema, no cyanosis no clubbing, slightly bounding pulse SKIN:  No rashes no nodules NEURO:  Cranial nerves II through XII grossly intact, motor grossly intact throughout PSYCH:  Cognitively intact, oriented to person place and time    EKG:  EKG is ordered today. The ekg ordered today demonstrates sinus rhythm, rate 79, axis within normal limits, intervals within normal limits, no acute ST-T wave changes.   Recent Labs: No results found for requested labs within last 365 days.     Lipid Panel No results found for: CHOL, TRIG, HDL, CHOLHDL, VLDL, LDLCALC, LDLDIRECT    Wt Readings from Last 3 Encounters:  09/08/14 220 lb (99.791 kg)  03/19/12 222 lb 12.8 oz (101.061 kg)  03/06/12 224 lb 13.9 oz (102 kg)      Other studies Reviewed: Additional studies/ records that were reviewed today include: Previous echo. Review of the above records demonstrates:  Please see elsewhere in the note.     ASSESSMENT AND PLAN:  AI: The patient did have a dilated LV at the last echo though the AI was only moderate. I discussed with him that LV dilatation is an indication for valve replacement if this has progressed. Fortunately he did not return for follow-up but understands he needs to come back yearly. He also understands that he will at some point need valve replacement and I cannot exactly predict when this would be. I will be getting an echo today looking stenosis severity of his AI as well as the size of his LV and LV function.   Current medicines are reviewed at length with the patient today.  The patient does not have concerns regarding medicines.  The following changes have been made:  no change  Labs/ tests ordered today include:   Orders Placed This Encounter  Procedures  . EKG 12-Lead  . ECHOCARDIOGRAM COMPLETE     Disposition:   FU with me in one year.     Signed, Minus Breeding, MD  09/08/2014 3:41 PM    [facility]

## 2014-09-08 NOTE — Patient Instructions (Signed)
Your physician recommends that you schedule a follow-up appointment in: one year with Dr. Percival Spanish  We are ordering an Echo for you to get done

## 2014-09-15 ENCOUNTER — Ambulatory Visit (HOSPITAL_COMMUNITY)
Admission: RE | Admit: 2014-09-15 | Discharge: 2014-09-15 | Disposition: A | Discharge status: Home / Self Care | Payer: Managed Care, Other (non HMO) | Source: Ambulatory Visit | Attending: Cardiology | Admitting: Cardiology

## 2014-09-15 DIAGNOSIS — I350 Nonrheumatic aortic (valve) stenosis: Secondary | ICD-10-CM | POA: Insufficient documentation

## 2014-09-15 DIAGNOSIS — I358 Other nonrheumatic aortic valve disorders: Secondary | ICD-10-CM | POA: Diagnosis present

## 2014-09-15 DIAGNOSIS — I340 Nonrheumatic mitral (valve) insufficiency: Secondary | ICD-10-CM | POA: Insufficient documentation

## 2014-09-15 DIAGNOSIS — I371 Nonrheumatic pulmonary valve insufficiency: Secondary | ICD-10-CM | POA: Diagnosis not present

## 2014-09-15 DIAGNOSIS — I351 Nonrheumatic aortic (valve) insufficiency: Secondary | ICD-10-CM | POA: Diagnosis not present

## 2014-09-15 DIAGNOSIS — I071 Rheumatic tricuspid insufficiency: Secondary | ICD-10-CM | POA: Diagnosis not present

## 2014-09-15 DIAGNOSIS — E785 Hyperlipidemia, unspecified: Secondary | ICD-10-CM

## 2015-09-14 NOTE — Progress Notes (Signed)
Cardiology Office Note   Date:  09/15/2015   ID:  Victor Dunlap, DOB [birth year], MRN [id number]  PCP:  Donnajean Lopes, MD  Cardiologist:   Minus Breeding, MD   Chief Complaint  Patient presents with  . Aortic insufficiency      History of Present Illness: Victor Dunlap is a 50 y.o. male who presents for evaluation of aortic insufficiency with a probable bicuspid aortic valve.  He was previously followed by Dr. Doreatha Lew.  I saw him last year. He has a history bicuspid Ao Valve with AI.  His last echo was in 2016.  He had moderate to severe insufficiency with mild LVH. His LV end-diastolic dimension was 64 mm.    He presents for follow up.  He reports that he does well.  The patient denies any new symptoms such as chest discomfort, neck or arm discomfort. There has been no new shortness of breath, PND or orthopnea. There have been no reported palpitations, presyncope or syncope.  He exercises 3 to 4 times per week.  He did stop smoking .      Past Medical History  Diagnosis Date  . Aortic insufficiency   . Hypercholesteremia   . DDD (degenerative disc disease)   . Sarcoid ([place]) 03/06/2012    pulmonary nodules  . Bicuspid aortic valve     Past Surgical History  Procedure Laterality Date  . Tonsillectomy  1970's  . Hernia repair  ~ 99991111    "umbilical" ([id number])  . Video bronchoscopy  03/10/2012    Procedure: VIDEO BRONCHOSCOPY WITHOUT FLUORO;  Surgeon: Rush Farmer, MD;  Location: [facility] ENDOSCOPY;  Service: Endoscopy;  Laterality: Bilateral;     Current Outpatient Prescriptions  Medication Sig Dispense Refill  . lovastatin (MEVACOR) 20 MG tablet Take 20 mg by mouth daily.     No current facility-administered medications for this visit.    Allergies:   Review of patient's allergies indicates no known allergies.     ROS:  Please see the history of present illness.   Otherwise, review of systems are positive for none.   All other systems are reviewed and negative.      PHYSICAL EXAM: VS:  BP 119/62 mmHg  Pulse 68  Ht 6\' 4"  (1.93 m)  Wt 227 lb 12.8 oz (103.329 kg)  BMI 27.74 kg/m2 , BMI Body mass index is 27.74 kg/(m^2). GENERAL:  Well appearing NECK:  No jugular venous distention, waveform within normal limits, carotid upstroke brisk and symmetric, no bruits, no thyromegaly LUNGS:  Clear to auscultation bilaterally CHEST:  Unremarkable HEART:  PMI not displaced or sustained,S1 and S2 within normal limits, no S3, no S4, no clicks, no rubs, 2 out of 6 apical systolic murmur radiating slightly to the axilla, 2/6 diastolic murmur ending in mid diastole and heard at the left third intercostal space murmurs ABD:  Flat, positive bowel sounds normal in frequency in pitch, no bruits, no rebound, no guarding, no midline pulsatile mass, no hepatomegaly, no splenomegaly EXT:  2 plus pulses throughout, no edema, no cyanosis no clubbing, slightly bounding pulse    EKG:  EKG is ordered today. The ekg ordered today demonstrates sinus rhythm, rate 68, axis within normal limits, intervals within normal limits.  LVH unchanged.  There is T wave inversion in lead III only that is new since previous.     Recent Labs: No results found for requested labs within last 365 days.    Lipid Panel No results found for: CHOL, TRIG,  HDL, CHOLHDL, VLDL, LDLCALC, LDLDIRECT    Wt Readings from Last 3 Encounters:  09/15/15 227 lb 12.8 oz (103.329 kg)  09/08/14 220 lb (99.791 kg)  03/19/12 222 lb 12.8 oz (101.061 kg)      Other studies Reviewed: Additional studies/ records that were reviewed today include: Previous echo. Review of the above records demonstrates:  Please see elsewhere in the note.     ASSESSMENT AND PLAN:  AI: The AI remain moderate on echocardiogram was 3 years apart.  The LV dimensions with the same.  He has no new symptoms. His exam is unremarkable. I plan an echocardiogram again next year prior to seeing him.  TOBACCO ABUSE:  He is encouraged to  continue complete abstinence  Current medicines are reviewed at length with the patient today.  The patient does not have concerns regarding medicines.  The following changes have been made:  no change  Labs/ tests ordered today include:   Orders Placed This Encounter  Procedures  . EKG 12-Lead  . ECHOCARDIOGRAM COMPLETE     Disposition:   FU with me in one year.     Signed, Minus Breeding, MD  09/15/2015 3:38 PM    [facility]

## 2015-09-15 ENCOUNTER — Encounter: Payer: Self-pay | Admitting: Cardiology

## 2015-09-15 ENCOUNTER — Ambulatory Visit (INDEPENDENT_AMBULATORY_CARE_PROVIDER_SITE_OTHER): Payer: Managed Care, Other (non HMO) | Admitting: Cardiology

## 2015-09-15 VITALS — BP 119/62 | HR 68 | Ht 76.0 in | Wt 227.8 lb

## 2015-09-15 DIAGNOSIS — I351 Nonrheumatic aortic (valve) insufficiency: Secondary | ICD-10-CM | POA: Diagnosis not present

## 2015-09-15 NOTE — Addendum Note (Signed)
Addended by: Vennie Homans on: 09/15/2015 04:00 PM   Modules accepted: Orders

## 2015-09-15 NOTE — Patient Instructions (Signed)
Medication Instructions:  Continue current medications  Labwork: NONE  Testing/Procedures: Your physician has requested that you have an echocardiogram. Echocardiography is a painless test that uses sound waves to create images of your heart. It provides your doctor with information about the size and shape of your heart and how well your heart's chambers and valves are working. This procedure takes approximately one hour. There are no restrictions for this procedure.  Follow-Up: Your physician wants you to follow-up in: 1 Year. You will receive a reminder letter in the mail two months in advance. If you don't receive a letter, please call our office to schedule the follow-up appointment.   Any Other Special Instructions Will Be Listed Below (If Applicable).   If you need a refill on your cardiac medications before your next appointment, please call your pharmacy.   

## 2015-09-20 ENCOUNTER — Encounter: Payer: Self-pay | Admitting: Internal Medicine

## 2015-11-28 ENCOUNTER — Telehealth: Payer: Self-pay | Admitting: *Deleted

## 2015-11-28 NOTE — Telephone Encounter (Signed)
Patient is for direct screening colonoscopy on 12/16/15. He does have a hx of Aortic Valve Insufficiency. He did have ov with cardiologist on 09/15/15 looks like he ordered an Echo. But pt did not make this appointment, pt seems to think its not due until 2018 under appointment notes. Last echo was June 2016. Based on this echo is patient ok to have direct colon at [facility] at this time? Or does he need another echo.Marland KitchenMarland KitchenPlease review. thanks, Kelci Petrella PV

## 2015-11-28 NOTE — Telephone Encounter (Signed)
Victor Dunlap,  This pt is cleared for anesthetic care at [facility].  Jenny Reichmann

## 2015-11-29 NOTE — Telephone Encounter (Signed)
OK per Jenny Reichmann for colon at [facility]. Will proceed as a scheduled.

## 2015-12-01 ENCOUNTER — Ambulatory Visit (AMBULATORY_SURGERY_CENTER): Payer: Self-pay

## 2015-12-01 VITALS — Ht 76.0 in | Wt 230.4 lb

## 2015-12-01 DIAGNOSIS — Z1211 Encounter for screening for malignant neoplasm of colon: Secondary | ICD-10-CM

## 2015-12-02 ENCOUNTER — Encounter: Payer: Self-pay | Admitting: Internal Medicine

## 2015-12-16 ENCOUNTER — Encounter: Payer: Self-pay | Admitting: Internal Medicine

## 2015-12-16 ENCOUNTER — Ambulatory Visit (AMBULATORY_SURGERY_CENTER): Payer: Managed Care, Other (non HMO) | Admitting: Internal Medicine

## 2015-12-16 VITALS — BP 97/58 | HR 52 | Temp 98.4°F | Resp 13 | Ht 76.0 in | Wt 230.0 lb

## 2015-12-16 DIAGNOSIS — K621 Rectal polyp: Secondary | ICD-10-CM

## 2015-12-16 DIAGNOSIS — D128 Benign neoplasm of rectum: Secondary | ICD-10-CM

## 2015-12-16 DIAGNOSIS — D120 Benign neoplasm of cecum: Secondary | ICD-10-CM

## 2015-12-16 DIAGNOSIS — D122 Benign neoplasm of ascending colon: Secondary | ICD-10-CM

## 2015-12-16 DIAGNOSIS — Z1211 Encounter for screening for malignant neoplasm of colon: Secondary | ICD-10-CM

## 2015-12-16 MED ORDER — SODIUM CHLORIDE 0.9 % IV SOLN: 500.0000 mL | INTRAVENOUS | Status: AC

## 2015-12-16 NOTE — Op Note (Signed)
[facility] Patient Name: Victor Dunlap Procedure Date: 12/16/2015 10:54 AM MRN: [id number] Endoscopist: Gatha Mayer , MD Age: 50 Referring MD:  Date of Birth: 10-15-1965 Gender: Male Account #: [id number] Procedure:                Colonoscopy Indications:              Screening for colorectal malignant neoplasm, This                            is the patient's first colonoscopy Medicines:                Propofol per Anesthesia, Monitored Anesthesia Care Procedure:                Pre-Anesthesia Assessment:                           - Prior to the procedure, a History and Physical                            was performed, and patient medications and                            allergies were reviewed. The patient's tolerance of                            previous anesthesia was also reviewed. The risks                            and benefits of the procedure and the sedation                            options and risks were discussed with the patient.                            All questions were answered, and informed consent                            was obtained. Prior Anticoagulants: The patient has                            taken no previous anticoagulant or antiplatelet                            agents. ASA Grade Assessment: II - A patient with                            mild systemic disease. After reviewing the risks                            and benefits, the patient was deemed in                            satisfactory condition to undergo the procedure.  After obtaining informed consent, the colonoscope                            was passed under direct vision. Throughout the                            procedure, the patient's blood pressure, pulse, and                            oxygen saturations were monitored continuously. The                            Model PCF-[id number]) scope was introduced   through the anus and advanced to the the cecum,                            identified by appendiceal orifice and ileocecal                            valve. The ileocecal valve, appendiceal orifice,                            and rectum were photographed. The quality of the                            bowel preparation was good. The ileocecal valve,                            appendiceal orifice, and rectum were photographed. Scope In: 11:16:25 AM Scope Out: 11:44:57 AM Total Procedure Duration: 0 hours 28 minutes 32 seconds  Findings:                 The perianal and digital rectal examinations were                            normal. Pertinent negatives include normal prostate                            (size, shape, and consistency).                           Two sessile polyps were found in the ascending                            colon. The polyps were 15 to 25 mm in size. These                            polyps were removed with a hot snare. Resection and                            retrieval were complete. Verification of patient                            identification for the specimen was done. Estimated  blood loss was minimal. To prevent bleeding after                            the polypectomy of 25 mm polyp, two hemostatic                            clips were successfully placed (MR conditional).                            There was no bleeding during, or at the end, of the                            procedure.                           Three sessile polyps were found in the rectum,                            descending colon and cecum. The polyps were 5 to 8                            mm in size. These polyps were removed with a cold                            snare. Resection was complete, but the polyp tissue                            was only partially retrieved. descending polyp not                            recovered. Verification of patient  identification                            for the specimen was done. Estimated blood loss was                            minimal.                           The exam was otherwise without abnormality on                            direct and retroflexion views. Complications:            No immediate complications. Estimated Blood Loss:     Estimated blood loss was minimal. Impression:               - Two 15 to 25 mm polyps in the ascending colon,                            removed with a hot snare. Resected and retrieved.                           - Three 5 to 8 mm  polyps in the rectum, in the                            descending colon and in the cecum, removed with a                            cold snare. Complete resection. Partial retrieval.                            Descending lost.                           - The examination was otherwise normal on direct                            and retroflexion views. Recommendation:           - Patient has a contact number available for                            emergencies. The signs and symptoms of potential                            delayed complications were discussed with the                            patient. Return to normal activities tomorrow.                            Written discharge instructions were provided to the                            patient.                           - Resume previous diet.                           - Continue present medications.                           - No aspirin, ibuprofen, naproxen, or other                            non-steroidal anti-inflammatory drugs for 2 weeks                            after polyp removal.                           - Repeat colonoscopy is recommended. The                            colonoscopy date will be determined after pathology                            results from  today's exam become available for                            review. Gatha Mayer, MD 12/16/2015  11:56:43 AM This report has been signed electronically.

## 2015-12-16 NOTE — Progress Notes (Signed)
Called to room to assist during endoscopic procedure.  Patient ID and intended procedure confirmed with present staff. Received instructions for my participation in the procedure from the performing physician.  

## 2015-12-16 NOTE — Progress Notes (Signed)
A/ox3 pleased with MAC, report to Penny RN 

## 2015-12-16 NOTE — Patient Instructions (Addendum)
I found and removed 5 polyps today. One of the small ones was not recovered but the others will be analyzed. All look benign but suspect pre-cancerous. I will let you know pathology results and when to have another routine colonoscopy by mail.  I appreciate the opportunity to care for you. Gatha Mayer, MD, FACG   YOU HAD AN ENDOSCOPIC PROCEDURE TODAY AT [facility]:   Refer to the procedure report that was given to you for any specific questions about what was found during the examination.  If the procedure report does not answer your questions, please call your gastroenterologist to clarify.  If you requested that your care partner not be given the details of your procedure findings, then the procedure report has been included in a sealed envelope for you to review at your convenience later.  YOU SHOULD EXPECT: Some feelings of bloating in the abdomen. Passage of more gas than usual.  Walking can help get rid of the air that was put into your GI tract during the procedure and reduce the bloating. If you had a lower endoscopy (such as a colonoscopy or flexible sigmoidoscopy) you may notice spotting of blood in your stool or on the toilet paper. If you underwent a bowel prep for your procedure, you may not have a normal bowel movement for a few days.  Please Note:  You might notice some irritation and congestion in your nose or some drainage.  This is from the oxygen used during your procedure.  There is no need for concern and it should clear up in a day or so.  SYMPTOMS TO REPORT IMMEDIATELY:   Following lower endoscopy (colonoscopy or flexible sigmoidoscopy):  Excessive amounts of blood in the stool  Significant tenderness or worsening of abdominal pains  Swelling of the abdomen that is new, acute  Fever of 100F or higher    For urgent or emergent issues, a gastroenterologist can be reached at any hour by calling [phone].   DIET:  We do recommend a  small meal at first, but then you may proceed to your regular diet.  Drink plenty of fluids but you should avoid alcoholic beverages for 24 hours.  ACTIVITY:  You should plan to take it easy for the rest of today and you should NOT DRIVE or use heavy machinery until tomorrow (because of the sedation medicines used during the test).    FOLLOW UP: Our staff will call the number listed on your records the next business day following your procedure to check on you and address any questions or concerns that you may have regarding the information given to you following your procedure. If we do not reach you, we will leave a message.  However, if you are feeling well and you are not experiencing any problems, there is no need to return our call.  We will assume that you have returned to your regular daily activities without incident.  If any biopsies were taken you will be contacted by phone or by letter within the next 1-3 weeks.  Please call us at [phone] if you have not heard about the biopsies in 3 weeks.    SIGNATURES/CONFIDENTIALITY: You and/or your care partner have signed paperwork which will be entered into your electronic medical record.  These signatures attest to the fact that that the information above on your After Visit Summary has been reviewed and is understood.  Full responsibility of the confidentiality of this discharge information lies  with you and/or your care-partner.  NO ASPIRIN ,IBUPROFEN,NAPROXEN OR OTHER NON STEROIDAL ANTI INFLAMMATORY PRODUCTS FOR 2 WEEKS AFTER POLYP REMOVAL  INFORMATION ON POLYPS GIVEN TO YOU TODAY

## 2015-12-19 ENCOUNTER — Telehealth: Payer: Self-pay | Admitting: *Deleted

## 2015-12-19 NOTE — Telephone Encounter (Signed)
  Follow up Call-  Call back number 12/16/2015  Post procedure Call Back phone  # [phone]  Permission to leave phone message Yes  Some recent data might be hidden     Patient questions:  Do you have a fever, pain , or abdominal swelling? No. Pain Score  0 *  Have you tolerated food without any problems? Yes.    Have you been able to return to your normal activities? Yes.    Do you have any questions about your discharge instructions: Diet   No. Medications  No. Follow up visit  No.  Do you have questions or concerns about your Care? No.  Actions: * If pain score is 4 or above: No action needed, pain <4.  Wanted to know when he will get results,informed him to give [place] 1-3 weeks,verbalize understanding.

## 2015-12-21 ENCOUNTER — Encounter: Payer: Self-pay | Admitting: Internal Medicine

## 2015-12-21 DIAGNOSIS — Z86010 Personal history of colonic polyps: Secondary | ICD-10-CM

## 2015-12-21 DIAGNOSIS — Z860101 Personal history of adenomatous and serrated colon polyps: Secondary | ICD-10-CM | POA: Insufficient documentation

## 2015-12-21 HISTORY — DX: Personal history of colonic polyps: Z86.010

## 2015-12-21 NOTE — Progress Notes (Signed)
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

## 2016-01-18 ENCOUNTER — Telehealth: Payer: Self-pay | Admitting: Internal Medicine

## 2016-01-18 NOTE — Telephone Encounter (Signed)
Results were sent to patient via [place].  He says he does not use this.  I mailed him a copy of the results as requested.  All questions were answered

## 2016-05-24 ENCOUNTER — Encounter: Payer: Self-pay | Admitting: Internal Medicine

## 2016-06-04 ENCOUNTER — Encounter: Payer: Self-pay | Admitting: Internal Medicine

## 2016-07-25 ENCOUNTER — Encounter: Payer: Managed Care, Other (non HMO) | Admitting: Internal Medicine

## 2016-08-07 DIAGNOSIS — Z125 Encounter for screening for malignant neoplasm of prostate: Secondary | ICD-10-CM | POA: Diagnosis not present

## 2016-08-07 DIAGNOSIS — Z79899 Other long term (current) drug therapy: Secondary | ICD-10-CM | POA: Diagnosis not present

## 2016-08-07 DIAGNOSIS — Z0000 Encounter for general adult medical examination without abnormal findings: Secondary | ICD-10-CM | POA: Diagnosis not present

## 2016-08-14 DIAGNOSIS — Q231 Congenital insufficiency of aortic valve: Secondary | ICD-10-CM | POA: Diagnosis not present

## 2016-08-14 DIAGNOSIS — D860 Sarcoidosis of lung: Secondary | ICD-10-CM | POA: Diagnosis not present

## 2016-08-14 DIAGNOSIS — Z0000 Encounter for general adult medical examination without abnormal findings: Secondary | ICD-10-CM | POA: Diagnosis not present

## 2016-08-14 DIAGNOSIS — I351 Nonrheumatic aortic (valve) insufficiency: Secondary | ICD-10-CM | POA: Diagnosis not present

## 2016-08-14 DIAGNOSIS — B0222 Postherpetic trigeminal neuralgia: Secondary | ICD-10-CM | POA: Diagnosis not present

## 2016-08-14 DIAGNOSIS — Z1389 Encounter for screening for other disorder: Secondary | ICD-10-CM | POA: Diagnosis not present

## 2016-08-15 DIAGNOSIS — Z1212 Encounter for screening for malignant neoplasm of rectum: Secondary | ICD-10-CM | POA: Diagnosis not present

## 2016-08-28 ENCOUNTER — Encounter: Payer: Self-pay | Admitting: Internal Medicine

## 2016-08-28 ENCOUNTER — Ambulatory Visit (AMBULATORY_SURGERY_CENTER): Payer: Self-pay

## 2016-08-28 VITALS — Ht 76.0 in | Wt 226.6 lb

## 2016-08-28 DIAGNOSIS — Z86010 Personal history of colonic polyps: Secondary | ICD-10-CM

## 2016-08-28 NOTE — Progress Notes (Signed)
Per pt, no allergies to soy or egg products.Pt not taking any weight loss meds or using  O2 at home.   Pt refused Emmi video. 

## 2016-09-06 DIAGNOSIS — H16301 Unspecified interstitial keratitis, right eye: Secondary | ICD-10-CM | POA: Diagnosis not present

## 2016-09-11 ENCOUNTER — Ambulatory Visit (AMBULATORY_SURGERY_CENTER): Payer: BLUE CROSS/BLUE SHIELD | Admitting: Internal Medicine

## 2016-09-11 ENCOUNTER — Encounter: Payer: Self-pay | Admitting: Internal Medicine

## 2016-09-11 VITALS — BP 111/63 | HR 57 | Temp 98.0°F | Resp 14 | Ht 76.0 in | Wt 226.0 lb

## 2016-09-11 DIAGNOSIS — D125 Benign neoplasm of sigmoid colon: Secondary | ICD-10-CM

## 2016-09-11 DIAGNOSIS — Z1211 Encounter for screening for malignant neoplasm of colon: Secondary | ICD-10-CM | POA: Diagnosis not present

## 2016-09-11 DIAGNOSIS — Z86010 Personal history of colonic polyps: Secondary | ICD-10-CM

## 2016-09-11 DIAGNOSIS — K635 Polyp of colon: Secondary | ICD-10-CM

## 2016-09-11 MED ORDER — SODIUM CHLORIDE 0.9 % IV SOLN: 500.0000 mL | INTRAVENOUS | Status: AC

## 2016-09-11 NOTE — Progress Notes (Signed)
Alert and oriented x3. Vss. Pt pleased with MAReport to Jud[facility] Roch

## 2016-09-11 NOTE — Patient Instructions (Addendum)
I did not see any remaining polyps that were removed last year but did see one small polyp not found last time and removed it.  I will let you know pathology results and when to have another routine colonoscopy by mail and/or My Chart. 3 years likely.  I appreciate the opportunity to care for you. Gatha Mayer, MD, FACG YOU HAD AN ENDOSCOPIC PROCEDURE TODAY AT [facility]:   Refer to the procedure report that was given to you for any specific questions about what was found during the examination.  If the procedure report does not answer your questions, please call your gastroenterologist to clarify.  If you requested that your care partner not be given the details of your procedure findings, then the procedure report has been included in a sealed envelope for you to review at your convenience later.  YOU SHOULD EXPECT: Some feelings of bloating in the abdomen. Passage of more gas than usual.  Walking can help get rid of the air that was put into your GI tract during the procedure and reduce the bloating. If you had a lower endoscopy (such as a colonoscopy or flexible sigmoidoscopy) you may notice spotting of blood in your stool or on the toilet paper. If you underwent a bowel prep for your procedure, you may not have a normal bowel movement for a few days.  Please Note:  You might notice some irritation and congestion in your nose or some drainage.  This is from the oxygen used during your procedure.  There is no need for concern and it should clear up in a day or so.  SYMPTOMS TO REPORT IMMEDIATELY:   Following lower endoscopy (colonoscopy or flexible sigmoidoscopy):  Excessive amounts of blood in the stool  Significant tenderness or worsening of abdominal pains  Swelling of the abdomen that is new, acute  Fever of 100F or higher  For urgent or emergent issues, a gastroenterologist can be reached at any hour by calling [phone].   DIET:  We do recommend a  small meal at first, but then you may proceed to your regular diet.  Drink plenty of fluids but you should avoid alcoholic beverages for 24 hours.  MEDICATIONS:  Continue present medications.  Please see handouts given to you by your recovery nurse.  ACTIVITY:  You should plan to take it easy for the rest of today and you should NOT DRIVE or use heavy machinery until tomorrow (because of the sedation medicines used during the test).    FOLLOW UP: Our staff will call the number listed on your records the next business day following your procedure to check on you and address any questions or concerns that you may have regarding the information given to you following your procedure. If we do not reach you, we will leave a message.  However, if you are feeling well and you are not experiencing any problems, there is no need to return our call.  We will assume that you have returned to your regular daily activities without incident.  If any biopsies were taken you will be contacted by phone or by letter within the next 1-3 weeks.  Please call us at [phone] if you have not heard about the biopsies in 3 weeks.   Thank you for allowing [place] to provide for your healthcare needs today.  SIGNATURES/CONFIDENTIALITY: You and/or your care partner have signed paperwork which will be entered into your electronic medical record.  These signatures attest  to the fact that that the information above on your After Visit Summary has been reviewed and is understood.  Full responsibility of the confidentiality of this discharge information lies with you and/or your care-partner. 

## 2016-09-11 NOTE — Op Note (Signed)
[facility] Patient Name: Victor Dunlap Procedure Date: 09/11/2016 9:21 AM MRN: [id number] Endoscopist: Gatha Mayer , MD Age: 51 Referring MD:  Date of Birth: December 31, 1965 Gender: Male Account #: [id number] Procedure:                Colonoscopy Indications:              Surveillance: Piecemeal removal of large sessile                            adenoma last colonoscopy (< 3 yrs) Medicines:                Propofol per Anesthesia, Monitored Anesthesia Care Procedure:                Pre-Anesthesia Assessment:                           - Prior to the procedure, a History and Physical                            was performed, and patient medications and                            allergies were reviewed. The patient's tolerance of                            previous anesthesia was also reviewed. The risks                            and benefits of the procedure and the sedation                            options and risks were discussed with the patient.                            All questions were answered, and informed consent                            was obtained. Prior Anticoagulants: The patient has                            taken no previous anticoagulant or antiplatelet                            agents. ASA Grade Assessment: II - A patient with                            mild systemic disease. After reviewing the risks                            and benefits, the patient was deemed in                            satisfactory condition to undergo the procedure.  After obtaining informed consent, the colonoscope                            was passed under direct vision. Throughout the                            procedure, the patient's blood pressure, pulse, and                            oxygen saturations were monitored continuously. The                            Colonoscope was introduced through the anus and   advanced to the the cecum, identified by                            appendiceal orifice and ileocecal valve. The                            colonoscopy was performed without difficulty. The                            patient tolerated the procedure well. The quality                            of the bowel preparation was good. The ileocecal                            valve, appendiceal orifice, and rectum were                            photographed. Scope In: 9:38:51 AM Scope Out: 9:56:51 AM Scope Withdrawal Time: 0 hours 15 minutes 21 seconds  Total Procedure Duration: 0 hours 18 minutes 0 seconds  Findings:                 The perianal and digital rectal examinations were                            normal. Pertinent negatives include normal prostate                            (size, shape, and consistency).                           A 5 mm polyp was found in the distal sigmoid colon.                            The polyp was semi-pedunculated. The polyp was                            removed with a cold snare. Resection and retrieval                            were complete. Verification of patient  identification for the specimen was done. Estimated                            blood loss was minimal.                           A few small-mouthed diverticula were found in the                            sigmoid colon.                           The exam was otherwise without abnormality on                            direct and retroflexion views.                           No signs of residual polyps removed from right                            colon 8 mos ago Complications:            No immediate complications. Estimated Blood Loss:     Estimated blood loss was minimal. Impression:               - One 5 mm polyp in the distal sigmoid colon,                            removed with a cold snare. Resected and retrieved.                           - Diverticulosis  in the sigmoid colon.                           - The examination was otherwise normal on direct                            and retroflexion views. No residual polyps seen had                            25 mm sessile adenoma removed 12/2015 Recommendation:           - Patient has a contact number available for                            emergencies. The signs and symptoms of potential                            delayed complications were discussed with the                            patient. Return to normal activities tomorrow.                            Written discharge  instructions were provided to the                            patient.                           - Resume previous diet.                           - Continue present medications.                           - Repeat colonoscopy in 3 years for surveillance. Gatha Mayer, MD 09/11/2016 10:02:49 AM This report has been signed electronically.

## 2016-09-11 NOTE — Progress Notes (Signed)
Called to room to assist during endoscopic procedure.  Patient ID and intended procedure confirmed with present staff. Received instructions for my participation in the procedure from the performing physician.  

## 2016-09-12 ENCOUNTER — Telehealth: Payer: Self-pay | Admitting: *Deleted

## 2016-09-12 NOTE — Telephone Encounter (Signed)
  Follow up Call-  Call back number 09/11/2016 12/16/2015  Post procedure Call Back phone  # [phone]  Permission to leave phone message Yes Yes  Some recent data might be hidden     Patient questions:  Do you have a fever, pain , or abdominal swelling? No. Pain Score  0 *  Have you tolerated food without any problems? Yes.    Have you been able to return to your normal activities? Yes.    Do you have any questions about your discharge instructions: Diet   No. Medications  No. Follow up visit  No.  Do you have questions or concerns about your Care? No.  Actions: * If pain score is 4 or above: No action needed, pain <4.

## 2016-09-19 ENCOUNTER — Encounter: Payer: Self-pay | Admitting: Internal Medicine

## 2016-09-19 NOTE — Progress Notes (Signed)
Hyperplastic sigmoid Hx large adenoma 2017 Recall colon 2020 My Chart letter

## 2016-10-23 ENCOUNTER — Ambulatory Visit (HOSPITAL_COMMUNITY): Payer: BLUE CROSS/BLUE SHIELD | Attending: Cardiology

## 2016-10-23 ENCOUNTER — Other Ambulatory Visit: Payer: Self-pay

## 2016-10-23 DIAGNOSIS — I350 Nonrheumatic aortic (valve) stenosis: Secondary | ICD-10-CM | POA: Insufficient documentation

## 2016-10-23 DIAGNOSIS — I351 Nonrheumatic aortic (valve) insufficiency: Secondary | ICD-10-CM

## 2016-11-19 DIAGNOSIS — H16301 Unspecified interstitial keratitis, right eye: Secondary | ICD-10-CM | POA: Diagnosis not present

## 2016-11-22 DIAGNOSIS — Z6828 Body mass index (BMI) 28.0-28.9, adult: Secondary | ICD-10-CM | POA: Diagnosis not present

## 2016-11-22 DIAGNOSIS — B0222 Postherpetic trigeminal neuralgia: Secondary | ICD-10-CM | POA: Diagnosis not present

## 2016-11-30 DIAGNOSIS — I351 Nonrheumatic aortic (valve) insufficiency: Secondary | ICD-10-CM | POA: Diagnosis not present

## 2016-11-30 DIAGNOSIS — Q231 Congenital insufficiency of aortic valve: Secondary | ICD-10-CM | POA: Diagnosis not present

## 2016-11-30 DIAGNOSIS — E785 Hyperlipidemia, unspecified: Secondary | ICD-10-CM | POA: Diagnosis not present

## 2017-03-08 DIAGNOSIS — I351 Nonrheumatic aortic (valve) insufficiency: Secondary | ICD-10-CM | POA: Diagnosis not present

## 2017-03-08 DIAGNOSIS — Q231 Congenital insufficiency of aortic valve: Secondary | ICD-10-CM | POA: Diagnosis not present

## 2017-04-19 DIAGNOSIS — Q231 Congenital insufficiency of aortic valve: Secondary | ICD-10-CM | POA: Diagnosis not present

## 2017-04-19 DIAGNOSIS — I351 Nonrheumatic aortic (valve) insufficiency: Secondary | ICD-10-CM | POA: Diagnosis not present

## 2017-04-19 DIAGNOSIS — E785 Hyperlipidemia, unspecified: Secondary | ICD-10-CM | POA: Diagnosis not present

## 2017-08-13 DIAGNOSIS — Z125 Encounter for screening for malignant neoplasm of prostate: Secondary | ICD-10-CM | POA: Diagnosis not present

## 2017-08-13 DIAGNOSIS — Z0000 Encounter for general adult medical examination without abnormal findings: Secondary | ICD-10-CM | POA: Diagnosis not present

## 2017-08-20 DIAGNOSIS — L988 Other specified disorders of the skin and subcutaneous tissue: Secondary | ICD-10-CM | POA: Diagnosis not present

## 2017-08-20 DIAGNOSIS — Z1389 Encounter for screening for other disorder: Secondary | ICD-10-CM | POA: Diagnosis not present

## 2017-08-20 DIAGNOSIS — Z0000 Encounter for general adult medical examination without abnormal findings: Secondary | ICD-10-CM | POA: Diagnosis not present

## 2017-08-20 DIAGNOSIS — B0222 Postherpetic trigeminal neuralgia: Secondary | ICD-10-CM | POA: Diagnosis not present

## 2017-08-20 DIAGNOSIS — R0683 Snoring: Secondary | ICD-10-CM | POA: Diagnosis not present

## 2017-08-20 DIAGNOSIS — Q231 Congenital insufficiency of aortic valve: Secondary | ICD-10-CM | POA: Diagnosis not present

## 2017-08-21 DIAGNOSIS — Z1212 Encounter for screening for malignant neoplasm of rectum: Secondary | ICD-10-CM | POA: Diagnosis not present

## 2017-11-21 DIAGNOSIS — H179 Unspecified corneal scar and opacity: Secondary | ICD-10-CM | POA: Diagnosis not present

## 2018-01-16 DIAGNOSIS — Z23 Encounter for immunization: Secondary | ICD-10-CM | POA: Diagnosis not present

## 2018-01-16 DIAGNOSIS — Q231 Congenital insufficiency of aortic valve: Secondary | ICD-10-CM | POA: Diagnosis not present

## 2018-01-16 DIAGNOSIS — I519 Heart disease, unspecified: Secondary | ICD-10-CM | POA: Diagnosis not present

## 2018-01-16 DIAGNOSIS — I351 Nonrheumatic aortic (valve) insufficiency: Secondary | ICD-10-CM | POA: Diagnosis not present

## 2018-01-16 DIAGNOSIS — Z87891 Personal history of nicotine dependence: Secondary | ICD-10-CM | POA: Diagnosis not present

## 2018-01-16 DIAGNOSIS — I77810 Thoracic aortic ectasia: Secondary | ICD-10-CM | POA: Diagnosis not present

## 2018-04-30 DIAGNOSIS — B029 Zoster without complications: Secondary | ICD-10-CM | POA: Diagnosis not present

## 2018-04-30 DIAGNOSIS — B0229 Other postherpetic nervous system involvement: Secondary | ICD-10-CM | POA: Diagnosis not present

## 2018-08-20 DIAGNOSIS — Z0000 Encounter for general adult medical examination without abnormal findings: Secondary | ICD-10-CM | POA: Diagnosis not present

## 2018-08-20 DIAGNOSIS — Z125 Encounter for screening for malignant neoplasm of prostate: Secondary | ICD-10-CM | POA: Diagnosis not present

## 2018-08-20 DIAGNOSIS — E7849 Other hyperlipidemia: Secondary | ICD-10-CM | POA: Diagnosis not present

## 2018-08-26 DIAGNOSIS — Q231 Congenital insufficiency of aortic valve: Secondary | ICD-10-CM | POA: Diagnosis not present

## 2018-08-26 DIAGNOSIS — B0222 Postherpetic trigeminal neuralgia: Secondary | ICD-10-CM | POA: Diagnosis not present

## 2018-08-26 DIAGNOSIS — I351 Nonrheumatic aortic (valve) insufficiency: Secondary | ICD-10-CM | POA: Diagnosis not present

## 2018-08-26 DIAGNOSIS — Z1331 Encounter for screening for depression: Secondary | ICD-10-CM | POA: Diagnosis not present

## 2018-08-26 DIAGNOSIS — D860 Sarcoidosis of lung: Secondary | ICD-10-CM | POA: Diagnosis not present

## 2018-08-26 DIAGNOSIS — Z0000 Encounter for general adult medical examination without abnormal findings: Secondary | ICD-10-CM | POA: Diagnosis not present

## 2018-08-27 ENCOUNTER — Encounter: Payer: Self-pay | Admitting: Internal Medicine

## 2018-10-13 ENCOUNTER — Telehealth: Payer: Self-pay | Admitting: *Deleted

## 2018-10-13 NOTE — Telephone Encounter (Signed)
[name], stated he is seeing a doctor at [facility].

## 2018-11-05 DIAGNOSIS — Z1159 Encounter for screening for other viral diseases: Secondary | ICD-10-CM | POA: Diagnosis not present

## 2018-11-26 DIAGNOSIS — H16301 Unspecified interstitial keratitis, right eye: Secondary | ICD-10-CM | POA: Diagnosis not present

## 2019-01-22 DIAGNOSIS — Q231 Congenital insufficiency of aortic valve: Secondary | ICD-10-CM | POA: Diagnosis not present

## 2019-01-22 DIAGNOSIS — I351 Nonrheumatic aortic (valve) insufficiency: Secondary | ICD-10-CM | POA: Diagnosis not present

## 2019-01-22 DIAGNOSIS — Z79899 Other long term (current) drug therapy: Secondary | ICD-10-CM | POA: Diagnosis not present

## 2019-01-22 DIAGNOSIS — Z23 Encounter for immunization: Secondary | ICD-10-CM | POA: Diagnosis not present

## 2019-01-22 DIAGNOSIS — I77810 Thoracic aortic ectasia: Secondary | ICD-10-CM | POA: Diagnosis not present

## 2019-01-22 DIAGNOSIS — I517 Cardiomegaly: Secondary | ICD-10-CM | POA: Diagnosis not present

## 2019-01-22 DIAGNOSIS — Z87891 Personal history of nicotine dependence: Secondary | ICD-10-CM | POA: Diagnosis not present

## 2019-02-05 DIAGNOSIS — Z20828 Contact with and (suspected) exposure to other viral communicable diseases: Secondary | ICD-10-CM | POA: Diagnosis not present

## 2019-05-22 ENCOUNTER — Other Ambulatory Visit: Payer: Self-pay

## 2019-05-25 ENCOUNTER — Ambulatory Visit: Payer: Self-pay

## 2019-06-26 ENCOUNTER — Ambulatory Visit: Payer: BC Managed Care – PPO | Attending: Internal Medicine

## 2019-06-26 DIAGNOSIS — Z23 Encounter for immunization: Secondary | ICD-10-CM

## 2019-06-26 NOTE — Progress Notes (Signed)
   Covid-19 Vaccination Clinic  Name:  Victor Dunlap    MRN: [id number] DOB: 02-13-1966  06/26/2019  [name] was observed post Covid-19 immunization for 15 minutes without incident. He was provided with Vaccine Information Sheet and instruction to access the V-Safe system.   [name] was instructed to call 911 with any severe reactions post vaccine: Marland Kitchen Difficulty breathing  . Swelling of face and throat  . A fast heartbeat  . A bad rash all over body  . Dizziness and weakness   Immunizations Administered    Name Date Dose VIS Date Route   Pfizer COVID-19 Vaccine 06/26/2019 12:53 PM 0.3 mL 03/20/2019 Intramuscular   Manufacturer: [place]   Lot: [id number]   [place]: [id number]

## 2019-07-21 ENCOUNTER — Ambulatory Visit: Payer: BC Managed Care – PPO | Attending: Internal Medicine

## 2019-07-21 DIAGNOSIS — Z23 Encounter for immunization: Secondary | ICD-10-CM

## 2019-07-21 NOTE — Progress Notes (Signed)
   Covid-19 Vaccination Clinic  Name:  Victor Dunlap    MRN: [id number] DOB: 1965-09-30  07/21/2019  Victor Dunlap was observed post Covid-19 immunization for 15 minutes without incident. He was provided with Vaccine Information Sheet and instruction to access the V-Safe system.   Victor Dunlap was instructed to call 911 with any severe reactions post vaccine: Marland Kitchen Difficulty breathing  . Swelling of face and throat  . A fast heartbeat  . A bad rash all over body  . Dizziness and weakness   Immunizations Administered    Name Date Dose VIS Date Route   Pfizer COVID-19 Vaccine 07/21/2019  1:38 PM 0.3 mL 03/20/2019 Intramuscular   Manufacturer: [place]   Lot: [id number]   [place]: [id number]

## 2019-08-20 DIAGNOSIS — Z0000 Encounter for general adult medical examination without abnormal findings: Secondary | ICD-10-CM | POA: Diagnosis not present

## 2019-08-20 DIAGNOSIS — Z125 Encounter for screening for malignant neoplasm of prostate: Secondary | ICD-10-CM | POA: Diagnosis not present

## 2019-08-20 DIAGNOSIS — E7849 Other hyperlipidemia: Secondary | ICD-10-CM | POA: Diagnosis not present

## 2019-08-28 DIAGNOSIS — Z1331 Encounter for screening for depression: Secondary | ICD-10-CM | POA: Diagnosis not present

## 2019-08-28 DIAGNOSIS — Z0000 Encounter for general adult medical examination without abnormal findings: Secondary | ICD-10-CM | POA: Diagnosis not present

## 2019-08-28 DIAGNOSIS — I351 Nonrheumatic aortic (valve) insufficiency: Secondary | ICD-10-CM | POA: Diagnosis not present

## 2019-08-28 DIAGNOSIS — D860 Sarcoidosis of lung: Secondary | ICD-10-CM | POA: Diagnosis not present

## 2019-08-28 DIAGNOSIS — R82998 Other abnormal findings in urine: Secondary | ICD-10-CM | POA: Diagnosis not present

## 2019-08-28 DIAGNOSIS — Z1212 Encounter for screening for malignant neoplasm of rectum: Secondary | ICD-10-CM | POA: Diagnosis not present

## 2019-08-28 DIAGNOSIS — B0229 Other postherpetic nervous system involvement: Secondary | ICD-10-CM | POA: Diagnosis not present

## 2019-08-28 DIAGNOSIS — Q231 Congenital insufficiency of aortic valve: Secondary | ICD-10-CM | POA: Diagnosis not present

## 2020-01-21 DIAGNOSIS — I77810 Thoracic aortic ectasia: Secondary | ICD-10-CM | POA: Diagnosis not present

## 2020-01-21 DIAGNOSIS — Z23 Encounter for immunization: Secondary | ICD-10-CM | POA: Diagnosis not present

## 2020-01-21 DIAGNOSIS — Q231 Congenital insufficiency of aortic valve: Secondary | ICD-10-CM | POA: Diagnosis not present

## 2020-01-21 DIAGNOSIS — I352 Nonrheumatic aortic (valve) stenosis with insufficiency: Secondary | ICD-10-CM | POA: Diagnosis not present

## 2020-01-21 DIAGNOSIS — I351 Nonrheumatic aortic (valve) insufficiency: Secondary | ICD-10-CM | POA: Diagnosis not present

## 2020-02-26 DIAGNOSIS — H25813 Combined forms of age-related cataract, bilateral: Secondary | ICD-10-CM | POA: Diagnosis not present

## 2021-08-11 DIAGNOSIS — B0229 Other postherpetic nervous system involvement: Secondary | ICD-10-CM | POA: Diagnosis not present

## 2021-09-15 DIAGNOSIS — B0229 Other postherpetic nervous system involvement: Secondary | ICD-10-CM | POA: Diagnosis not present

## 2021-11-09 DIAGNOSIS — I517 Cardiomegaly: Secondary | ICD-10-CM | POA: Diagnosis not present

## 2021-11-09 DIAGNOSIS — I77810 Thoracic aortic ectasia: Secondary | ICD-10-CM | POA: Diagnosis not present

## 2021-11-09 DIAGNOSIS — E785 Hyperlipidemia, unspecified: Secondary | ICD-10-CM | POA: Diagnosis not present

## 2021-11-09 DIAGNOSIS — I351 Nonrheumatic aortic (valve) insufficiency: Secondary | ICD-10-CM | POA: Diagnosis not present

## 2021-11-09 DIAGNOSIS — Q231 Congenital insufficiency of aortic valve: Secondary | ICD-10-CM | POA: Diagnosis not present

## 2021-12-12 DIAGNOSIS — Z0000 Encounter for general adult medical examination without abnormal findings: Secondary | ICD-10-CM | POA: Diagnosis not present

## 2021-12-14 DIAGNOSIS — Z125 Encounter for screening for malignant neoplasm of prostate: Secondary | ICD-10-CM | POA: Diagnosis not present

## 2021-12-14 DIAGNOSIS — E785 Hyperlipidemia, unspecified: Secondary | ICD-10-CM | POA: Diagnosis not present

## 2021-12-19 DIAGNOSIS — Z0000 Encounter for general adult medical examination without abnormal findings: Secondary | ICD-10-CM | POA: Diagnosis not present

## 2021-12-19 DIAGNOSIS — R82998 Other abnormal findings in urine: Secondary | ICD-10-CM | POA: Diagnosis not present

## 2021-12-19 DIAGNOSIS — Z1339 Encounter for screening examination for other mental health and behavioral disorders: Secondary | ICD-10-CM | POA: Diagnosis not present

## 2021-12-19 DIAGNOSIS — Q230 Congenital stenosis of aortic valve: Secondary | ICD-10-CM | POA: Diagnosis not present

## 2021-12-19 DIAGNOSIS — Z1331 Encounter for screening for depression: Secondary | ICD-10-CM | POA: Diagnosis not present

## 2022-05-21 DIAGNOSIS — R972 Elevated prostate specific antigen [PSA]: Secondary | ICD-10-CM | POA: Diagnosis not present

## 2022-11-28 DIAGNOSIS — I77810 Thoracic aortic ectasia: Secondary | ICD-10-CM | POA: Diagnosis not present

## 2022-11-28 DIAGNOSIS — I351 Nonrheumatic aortic (valve) insufficiency: Secondary | ICD-10-CM | POA: Diagnosis not present

## 2022-11-28 DIAGNOSIS — Q231 Congenital insufficiency of aortic valve: Secondary | ICD-10-CM | POA: Diagnosis not present

## 2022-12-13 DIAGNOSIS — R972 Elevated prostate specific antigen [PSA]: Secondary | ICD-10-CM | POA: Diagnosis not present

## 2022-12-17 DIAGNOSIS — R972 Elevated prostate specific antigen [PSA]: Secondary | ICD-10-CM | POA: Diagnosis not present

## 2023-01-30 DIAGNOSIS — H43393 Other vitreous opacities, bilateral: Secondary | ICD-10-CM | POA: Diagnosis not present

## 2023-02-26 DIAGNOSIS — E785 Hyperlipidemia, unspecified: Secondary | ICD-10-CM | POA: Diagnosis not present

## 2023-02-26 DIAGNOSIS — R972 Elevated prostate specific antigen [PSA]: Secondary | ICD-10-CM | POA: Diagnosis not present

## 2023-03-05 DIAGNOSIS — Z0000 Encounter for general adult medical examination without abnormal findings: Secondary | ICD-10-CM | POA: Diagnosis not present

## 2023-03-05 DIAGNOSIS — Z1212 Encounter for screening for malignant neoplasm of rectum: Secondary | ICD-10-CM | POA: Diagnosis not present

## 2023-03-05 DIAGNOSIS — R82998 Other abnormal findings in urine: Secondary | ICD-10-CM | POA: Diagnosis not present

## 2023-04-25 DIAGNOSIS — B0229 Other postherpetic nervous system involvement: Secondary | ICD-10-CM | POA: Diagnosis not present

## 2023-06-10 DIAGNOSIS — R972 Elevated prostate specific antigen [PSA]: Secondary | ICD-10-CM | POA: Diagnosis not present

## 2023-06-17 DIAGNOSIS — R972 Elevated prostate specific antigen [PSA]: Secondary | ICD-10-CM | POA: Diagnosis not present

## 2023-06-18 ENCOUNTER — Other Ambulatory Visit: Payer: Self-pay | Admitting: Urology

## 2023-06-18 DIAGNOSIS — R972 Elevated prostate specific antigen [PSA]: Secondary | ICD-10-CM

## 2023-07-17 ENCOUNTER — Encounter: Payer: Self-pay | Admitting: Urology

## 2023-07-30 ENCOUNTER — Ambulatory Visit
Admission: RE | Admit: 2023-07-30 | Discharge: 2023-07-30 | Disposition: A | Discharge status: Home / Self Care | Source: Ambulatory Visit | Attending: Urology

## 2023-07-30 DIAGNOSIS — R972 Elevated prostate specific antigen [PSA]: Secondary | ICD-10-CM | POA: Diagnosis not present

## 2023-07-30 MED ORDER — GADOPICLENOL 0.5 MMOL/ML IV SOLN
10.0000 mL | Freq: Once | INTRAVENOUS | Status: AC | PRN
  Administered 2023-07-30: 10 mL via INTRAVENOUS

## 2023-08-13 DIAGNOSIS — N429 Disorder of prostate, unspecified: Secondary | ICD-10-CM | POA: Diagnosis not present

## 2023-08-13 DIAGNOSIS — R972 Elevated prostate specific antigen [PSA]: Secondary | ICD-10-CM | POA: Diagnosis not present

## 2023-08-29 DIAGNOSIS — C61 Malignant neoplasm of prostate: Secondary | ICD-10-CM | POA: Diagnosis not present

## 2023-08-29 DIAGNOSIS — N429 Disorder of prostate, unspecified: Secondary | ICD-10-CM | POA: Diagnosis not present

## 2023-09-19 DIAGNOSIS — C61 Malignant neoplasm of prostate: Secondary | ICD-10-CM | POA: Diagnosis not present

## 2023-09-19 DIAGNOSIS — C775 Secondary and unspecified malignant neoplasm of intrapelvic lymph nodes: Secondary | ICD-10-CM | POA: Diagnosis not present

## 2023-09-25 DIAGNOSIS — C61 Malignant neoplasm of prostate: Secondary | ICD-10-CM | POA: Diagnosis not present

## 2023-10-03 DIAGNOSIS — C61 Malignant neoplasm of prostate: Secondary | ICD-10-CM | POA: Diagnosis not present

## 2023-10-03 DIAGNOSIS — C775 Secondary and unspecified malignant neoplasm of intrapelvic lymph nodes: Secondary | ICD-10-CM | POA: Diagnosis not present

## 2023-10-10 DIAGNOSIS — Z79818 Long term (current) use of other agents affecting estrogen receptors and estrogen levels: Secondary | ICD-10-CM | POA: Diagnosis not present

## 2023-10-10 DIAGNOSIS — Z5111 Encounter for antineoplastic chemotherapy: Secondary | ICD-10-CM | POA: Diagnosis not present

## 2023-10-10 DIAGNOSIS — C61 Malignant neoplasm of prostate: Secondary | ICD-10-CM | POA: Diagnosis not present

## 2023-10-10 DIAGNOSIS — Z87891 Personal history of nicotine dependence: Secondary | ICD-10-CM | POA: Diagnosis not present

## 2023-10-10 DIAGNOSIS — C772 Secondary and unspecified malignant neoplasm of intra-abdominal lymph nodes: Secondary | ICD-10-CM | POA: Diagnosis not present

## 2023-12-05 DIAGNOSIS — C772 Secondary and unspecified malignant neoplasm of intra-abdominal lymph nodes: Secondary | ICD-10-CM | POA: Diagnosis not present

## 2023-12-05 DIAGNOSIS — C61 Malignant neoplasm of prostate: Secondary | ICD-10-CM | POA: Diagnosis not present

## 2023-12-11 DIAGNOSIS — C772 Secondary and unspecified malignant neoplasm of intra-abdominal lymph nodes: Secondary | ICD-10-CM | POA: Diagnosis not present

## 2023-12-11 DIAGNOSIS — E291 Testicular hypofunction: Secondary | ICD-10-CM | POA: Diagnosis not present

## 2023-12-11 DIAGNOSIS — Z79899 Other long term (current) drug therapy: Secondary | ICD-10-CM | POA: Diagnosis not present

## 2023-12-11 DIAGNOSIS — C61 Malignant neoplasm of prostate: Secondary | ICD-10-CM | POA: Diagnosis not present

## 2023-12-11 DIAGNOSIS — R399 Unspecified symptoms and signs involving the genitourinary system: Secondary | ICD-10-CM | POA: Diagnosis not present

## 2023-12-11 DIAGNOSIS — B0229 Other postherpetic nervous system involvement: Secondary | ICD-10-CM | POA: Diagnosis not present

## 2023-12-24 DIAGNOSIS — C61 Malignant neoplasm of prostate: Secondary | ICD-10-CM | POA: Diagnosis not present

## 2023-12-25 DIAGNOSIS — C61 Malignant neoplasm of prostate: Secondary | ICD-10-CM | POA: Diagnosis not present

## 2023-12-26 DIAGNOSIS — C61 Malignant neoplasm of prostate: Secondary | ICD-10-CM | POA: Diagnosis not present

## 2023-12-27 DIAGNOSIS — C61 Malignant neoplasm of prostate: Secondary | ICD-10-CM | POA: Diagnosis not present

## 2023-12-30 DIAGNOSIS — C61 Malignant neoplasm of prostate: Secondary | ICD-10-CM | POA: Diagnosis not present

## 2023-12-31 DIAGNOSIS — C61 Malignant neoplasm of prostate: Secondary | ICD-10-CM | POA: Diagnosis not present

## 2024-01-01 DIAGNOSIS — C61 Malignant neoplasm of prostate: Secondary | ICD-10-CM | POA: Diagnosis not present

## 2024-01-02 DIAGNOSIS — C61 Malignant neoplasm of prostate: Secondary | ICD-10-CM | POA: Diagnosis not present

## 2024-01-03 DIAGNOSIS — C61 Malignant neoplasm of prostate: Secondary | ICD-10-CM | POA: Diagnosis not present

## 2024-01-03 DIAGNOSIS — C775 Secondary and unspecified malignant neoplasm of intrapelvic lymph nodes: Secondary | ICD-10-CM | POA: Diagnosis not present

## 2024-01-06 DIAGNOSIS — C61 Malignant neoplasm of prostate: Secondary | ICD-10-CM | POA: Diagnosis not present

## 2024-01-07 DIAGNOSIS — C61 Malignant neoplasm of prostate: Secondary | ICD-10-CM | POA: Diagnosis not present

## 2024-01-08 DIAGNOSIS — C61 Malignant neoplasm of prostate: Secondary | ICD-10-CM | POA: Diagnosis not present

## 2024-01-09 DIAGNOSIS — C61 Malignant neoplasm of prostate: Secondary | ICD-10-CM | POA: Diagnosis not present

## 2024-01-10 DIAGNOSIS — C775 Secondary and unspecified malignant neoplasm of intrapelvic lymph nodes: Secondary | ICD-10-CM | POA: Diagnosis not present

## 2024-01-10 DIAGNOSIS — C61 Malignant neoplasm of prostate: Secondary | ICD-10-CM | POA: Diagnosis not present

## 2024-01-13 DIAGNOSIS — C61 Malignant neoplasm of prostate: Secondary | ICD-10-CM | POA: Diagnosis not present

## 2024-01-14 DIAGNOSIS — C61 Malignant neoplasm of prostate: Secondary | ICD-10-CM | POA: Diagnosis not present

## 2024-01-15 DIAGNOSIS — C61 Malignant neoplasm of prostate: Secondary | ICD-10-CM | POA: Diagnosis not present

## 2024-01-16 DIAGNOSIS — C61 Malignant neoplasm of prostate: Secondary | ICD-10-CM | POA: Diagnosis not present

## 2024-01-17 DIAGNOSIS — C61 Malignant neoplasm of prostate: Secondary | ICD-10-CM | POA: Diagnosis not present

## 2024-01-17 DIAGNOSIS — C772 Secondary and unspecified malignant neoplasm of intra-abdominal lymph nodes: Secondary | ICD-10-CM | POA: Diagnosis not present

## 2024-01-20 DIAGNOSIS — C61 Malignant neoplasm of prostate: Secondary | ICD-10-CM | POA: Diagnosis not present

## 2024-01-21 DIAGNOSIS — C61 Malignant neoplasm of prostate: Secondary | ICD-10-CM | POA: Diagnosis not present

## 2024-02-03 DIAGNOSIS — M7551 Bursitis of right shoulder: Secondary | ICD-10-CM | POA: Diagnosis not present

## 2024-02-26 DIAGNOSIS — M25511 Pain in right shoulder: Secondary | ICD-10-CM | POA: Diagnosis not present

## 2024-02-26 DIAGNOSIS — M7551 Bursitis of right shoulder: Secondary | ICD-10-CM | POA: Diagnosis not present

## 2024-03-09 DIAGNOSIS — R29898 Other symptoms and signs involving the musculoskeletal system: Secondary | ICD-10-CM | POA: Diagnosis not present

## 2024-03-09 DIAGNOSIS — M25511 Pain in right shoulder: Secondary | ICD-10-CM | POA: Diagnosis not present

## 2024-03-12 DIAGNOSIS — M25511 Pain in right shoulder: Secondary | ICD-10-CM | POA: Diagnosis not present

## 2024-03-12 DIAGNOSIS — R29898 Other symptoms and signs involving the musculoskeletal system: Secondary | ICD-10-CM | POA: Diagnosis not present

## 2024-03-24 DIAGNOSIS — E291 Testicular hypofunction: Secondary | ICD-10-CM | POA: Diagnosis not present

## 2024-03-24 DIAGNOSIS — C61 Malignant neoplasm of prostate: Secondary | ICD-10-CM | POA: Diagnosis not present

## 2024-03-24 DIAGNOSIS — C772 Secondary and unspecified malignant neoplasm of intra-abdominal lymph nodes: Secondary | ICD-10-CM | POA: Diagnosis not present

## 2024-06-01 ENCOUNTER — Encounter

## 2024-06-15 ENCOUNTER — Encounter: Admitting: Internal Medicine
